# Patient Record
Sex: Female | Born: 1965 | Race: White | Hispanic: No | Smoking: Former smoker
Health system: Southern US, Community
[De-identification: ages and names within clinical notes are randomized; demographics above are authoritative.]

## PROBLEM LIST (undated history)

## (undated) ENCOUNTER — Ambulatory Visit: Admission: EM | Source: Home / Self Care

## (undated) DIAGNOSIS — K219 Gastro-esophageal reflux disease without esophagitis: Secondary | ICD-10-CM

## (undated) DIAGNOSIS — M5136 Other intervertebral disc degeneration, lumbar region: Secondary | ICD-10-CM

## (undated) DIAGNOSIS — I10 Essential (primary) hypertension: Secondary | ICD-10-CM

## (undated) DIAGNOSIS — M51369 Other intervertebral disc degeneration, lumbar region without mention of lumbar back pain or lower extremity pain: Secondary | ICD-10-CM

## (undated) HISTORY — DX: Other intervertebral disc degeneration, lumbar region without mention of lumbar back pain or lower extremity pain: M51.369

## (undated) HISTORY — PX: CARPAL TUNNEL RELEASE: SHX101

## (undated) HISTORY — DX: Other intervertebral disc degeneration, lumbar region: M51.36

## (undated) HISTORY — PX: DILATION AND CURETTAGE OF UTERUS: SHX78

## (undated) HISTORY — DX: Gastro-esophageal reflux disease without esophagitis: K21.9

## (undated) HISTORY — PX: TRACHEOSTOMY: SUR1362

## (undated) HISTORY — PX: ARTHROSCOPIC REPAIR ACL: SUR80

## (undated) HISTORY — PX: OTHER SURGICAL HISTORY: SHX169

---
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
## 2019-10-27 ENCOUNTER — Emergency Department (INDEPENDENT_AMBULATORY_CARE_PROVIDER_SITE_OTHER)
Admission: EM | Admit: 2019-10-27 | Discharge: 2019-10-27 | Disposition: A | Discharge status: Home / Self Care | Payer: BLUE CROSS/BLUE SHIELD | Source: Home / Self Care

## 2019-10-27 ENCOUNTER — Emergency Department (INDEPENDENT_AMBULATORY_CARE_PROVIDER_SITE_OTHER): Payer: BLUE CROSS/BLUE SHIELD

## 2019-10-27 ENCOUNTER — Other Ambulatory Visit: Payer: Self-pay

## 2019-10-27 DIAGNOSIS — S62662A Nondisplaced fracture of distal phalanx of right middle finger, initial encounter for closed fracture: Secondary | ICD-10-CM | POA: Diagnosis not present

## 2019-10-27 MED ORDER — TRAMADOL HCL 50 MG PO TABS: 50.0000 mg | ORAL_TABLET | Freq: Four times a day (QID) | ORAL | 0 refills | Status: DC | PRN

## 2019-10-27 NOTE — ED Triage Notes (Signed)
Patient presents to Urgent Care with complaints of right middle finger pain since it got caught in her dog's leash while walking him around 0900 this morning. Patient reports she thinks she may have broken the tip of it, is unable to bend it.

## 2019-10-27 NOTE — ED Provider Notes (Signed)
[facility]    CSN: [id number] Arrival date & time: 10/27/19  1411      History   Chief Complaint Chief Complaint  Patient presents with  . Finger Injury    HPI Katrina Cole is a 54 y.o. female.   HPI  Katrina Cole is a 54 y.o. female presenting to UC with c/o sudden onset, gradually worsening pain and swelling to Right middle finger that occurred after she got her finger caught in her dog's leash as he pulled while they were walking around 9AM this morning. Pain is aching and sore, 8/10. Limited ROM.  She is Right hand dominant. No prior injury to that same finger in the past.    History reviewed. No pertinent past medical history.  There are no problems to display for this patient.   History reviewed. No pertinent surgical history.  OB History   No obstetric history on file.      Home Medications    Prior to Admission medications   Medication Sig Start Date End Date Taking? Authorizing Provider  omeprazole (PRILOSEC) 10 MG capsule Take 10 mg by mouth daily.   Yes [provider]  traMADol (ULTRAM) 50 MG tablet Take 1 tablet (50 mg total) by mouth every 6 (six) hours as needed. 10/27/19   Noe Gens, PA-C    Family History Family History  Problem Relation Age of Onset  . COPD Mother   . Hypertension Father     Social History Social History   Tobacco Use  . Smoking status: Never Smoker  . Smokeless tobacco: Never Used  Substance Use Topics  . Alcohol use: Not Currently  . Drug use: Not on file     Allergies   Penicillins   Review of Systems Review of Systems  Musculoskeletal: Positive for arthralgias and joint swelling.  Skin: Positive for color change. Negative for wound.  Neurological: Positive for weakness. Negative for numbness.     Physical Exam Triage Vital Signs ED Triage Vitals  Enc Vitals Group     BP 10/27/19 1428 (!) 157/84     Pulse Rate 10/27/19 1428 95     Resp 10/27/19 1428 16     Temp 10/27/19  1428 98.8 F (37.1 C)     Temp Source 10/27/19 1428 Oral     SpO2 10/27/19 1428 100 %     Weight --      Height --      Head Circumference --      Peak Flow --      Pain Score 10/27/19 1424 8     Pain Loc --      Pain Edu? --      Excl. in [place]? --    No data found.  Updated Vital Signs BP (!) 157/84 (BP Location: Left Arm)   Pulse 95   Temp 98.8 F (37.1 C) (Oral)   Resp 16   SpO2 100%   Visual Acuity Right Eye Distance:   Left Eye Distance:   Bilateral Distance:    Right Eye Near:   Left Eye Near:    Bilateral Near:     Physical Exam Vitals and nursing note reviewed.  Constitutional:      Appearance: Normal appearance. She is well-developed.  HENT:     Head: Normocephalic and atraumatic.  Cardiovascular:     Rate and Rhythm: Normal rate.  Pulmonary:     Effort: Pulmonary effort is normal.  Musculoskeletal:  General: Swelling and tenderness present.     Cervical back: Normal range of motion.     Comments: Right middle finger: moderate edema around DIP, tender. Limited flexion at DIP, full flexion at PIP.    Skin:    General: Skin is warm and dry.     Capillary Refill: Capillary refill takes less than 2 seconds.     Findings: Bruising present. No erythema.     Comments: Right middle finger: skin and nail in tact. Faint ecchymosis around DIP.   Neurological:     Mental Status: She is alert and oriented to person, place, and time.     Sensory: No sensory deficit.  Psychiatric:        Behavior: Behavior normal.      UC Treatments / Results  Labs (all labs ordered are listed, but only abnormal results are displayed) Labs Reviewed - No data to display  EKG   Radiology DG Finger Middle Right  Result Date: 10/27/2019 CLINICAL DATA:  Trauma to right middle finger last night. EXAM: RIGHT MIDDLE FINGER 2+V COMPARISON:  None. FINDINGS: Subtle minimally displaced chip fracture along the ulnar base of the third distal phalanx with involvement of the  articular surface. Remainder the exam is unremarkable. IMPRESSION: Minimally displaced chip fracture at the base of the third distal phalanx. Electronically Signed   By: Elberta Fortis M.D.   On: 10/27/2019 15:08    Procedures Splint Application  Date/Time: 10/27/2019 7:49 PM Performed by: Lurene Shadow, PA-C Authorized by: Lurene Shadow, PA-C   Consent:    Consent obtained:  Verbal   Consent given by:  Patient   Risks discussed:  Discoloration, numbness, pain and swelling   Alternatives discussed:  Delayed treatment and no treatment Pre-procedure details:    Sensation:  Normal   Skin color:  Faint ecchymosis Procedure details:    Laterality:  Right   Location:  Finger   Finger:  R long finger   Strapping: no     Splint type:  Finger   Supplies:  Elastic bandage Post-procedure details:    Pain:  Unchanged   Sensation:  Normal   Patient tolerance of procedure:  Tolerated well, no immediate complications   (including critical care time)  Medications Ordered in UC Medications - No data to display  Initial Impression / Assessment and Plan / UC Course  I have reviewed the triage vital signs and the nursing notes.  Pertinent labs & imaging results that were available during my care of the patient were reviewed by me and considered in my medical decision making (see chart for details).     Discussed imaging with pt Encouraged f/u with Sports Medicine next week  AVS provided  Final Clinical Impressions(s) / UC Diagnoses   Final diagnoses:  Closed nondisplaced fracture of distal phalanx of right middle finger, initial encounter     Discharge Instructions      Try to keep the finger splint on at all times except for applying a cool compress to help with pain and swelling. Try to keep hand elevated.  Tramadol is strong pain medication. While taking, do not drink alcohol, drive, or perform any other activities that requires focus while taking these medications.   You  may take 500mg  acetaminophen every 4-6 hours or in combination with ibuprofen 400-600mg  every 6-8 hours as needed for pain and inflammation.  Call to schedule a follow up appointment with Dr. (Dr. Benjamin Stain) sports medicine next week since the finger is  on your dominant hand to help ensure proper healing.      ED Prescriptions    Medication Sig Dispense Auth. Provider   traMADol (ULTRAM) 50 MG tablet Take 1 tablet (50 mg total) by mouth every 6 (six) hours as needed. 15 tablet Lurene Shadow, [place]     I have reviewed the PDMP during this encounter.   Lurene Shadow, [place] 10/27/19 1950

## 2019-10-27 NOTE — Discharge Instructions (Signed)
  Try to keep the finger splint on at all times except for applying a cool compress to help with pain and swelling. Try to keep hand elevated.  Tramadol is strong pain medication. While taking, do not drink alcohol, drive, or perform any other activities that requires focus while taking these medications.   You may take 500mg  acetaminophen every 4-6 hours or in combination with ibuprofen 400-600mg  every 6-8 hours as needed for pain and inflammation.  Call to schedule a follow up appointment with Dr. (Dr. Benjamin Stain) sports medicine next week since the finger is on your dominant hand to help ensure proper healing.

## 2019-11-01 ENCOUNTER — Encounter: Payer: Self-pay | Admitting: Sports Medicine

## 2019-11-01 ENCOUNTER — Other Ambulatory Visit: Payer: Self-pay

## 2019-11-01 ENCOUNTER — Ambulatory Visit (INDEPENDENT_AMBULATORY_CARE_PROVIDER_SITE_OTHER): Payer: BLUE CROSS/BLUE SHIELD | Admitting: Sports Medicine

## 2019-11-01 DIAGNOSIS — S62662A Nondisplaced fracture of distal phalanx of right middle finger, initial encounter for closed fracture: Secondary | ICD-10-CM

## 2019-11-01 DIAGNOSIS — S62632A Displaced fracture of distal phalanx of right middle finger, initial encounter for closed fracture: Secondary | ICD-10-CM | POA: Insufficient documentation

## 2019-11-01 MED ORDER — CALCIUM CARBONATE-VITAMIN D 600-400 MG-UNIT PO TABS: 1.0000 | ORAL_TABLET | Freq: Two times a day (BID) | ORAL | 11 refills | Status: DC

## 2019-11-01 MED ORDER — HYDROCODONE-ACETAMINOPHEN 5-325 MG PO TABS: 1.0000 | ORAL_TABLET | Freq: Three times a day (TID) | ORAL | 0 refills | Status: DC | PRN

## 2019-11-01 NOTE — Assessment & Plan Note (Signed)
Katrina Cole was walking her dog, she had the leash wrapped around her finger, the large husky bolted, and I broke her finger. She has significant pain, we applied a stack splint today, adding hydrocodone for pain. Calcium and vitamin D supplement. This will likely takes 4 to 6 weeks to heal. She does work at [employer] and so she will likely need to be on left-handed duty only, this will necessitate short-term disability.

## 2019-11-01 NOTE — Progress Notes (Signed)
    Procedures performed today:    None.  Independent interpretation of notes and tests performed by another provider:   None.  Brief History, Exam, Impression, and Recommendations:    Fracture of distal phalanx of right middle finger Katrina Cole was walking her dog, she had the leash wrapped around her finger, the large husky bolted, and I broke her finger. She has significant pain, we applied a stack splint today, adding hydrocodone for pain. Calcium and vitamin D supplement. This will likely takes 4 to 6 weeks to heal. She does work at [employer] and so she will likely need to be on left-handed duty only, this will necessitate short-term disability.    ___________________________________________ Ihor Austin. Benjamin Stain, M.D., ABFM., CAQSM. Primary Care and Sports Medicine [facility]  Adjunct Instructor of Family Medicine  [employer]

## 2019-11-14 ENCOUNTER — Other Ambulatory Visit: Payer: Self-pay

## 2019-11-14 ENCOUNTER — Emergency Department
Admission: EM | Admit: 2019-11-14 | Discharge: 2019-11-14 | Disposition: A | Discharge status: Home / Self Care | Payer: BLUE CROSS/BLUE SHIELD | Source: Home / Self Care | Attending: Family Medicine | Admitting: Family Medicine

## 2019-11-14 DIAGNOSIS — N3000 Acute cystitis without hematuria: Secondary | ICD-10-CM

## 2019-11-14 DIAGNOSIS — R300 Dysuria: Secondary | ICD-10-CM

## 2019-11-14 LAB — POCT URINALYSIS DIP (MANUAL ENTRY)
Bilirubin, UA: NEGATIVE
Glucose, UA: NEGATIVE mg/dL
Ketones, POC UA: NEGATIVE mg/dL
Nitrite, UA: NEGATIVE
Protein Ur, POC: NEGATIVE mg/dL
Spec Grav, UA: 1.01 (ref 1.010–1.025)
Urobilinogen, UA: 0.2 E.U./dL
pH, UA: 6 (ref 5.0–8.0)

## 2019-11-14 MED ORDER — NITROFURANTOIN MONOHYD MACRO 100 MG PO CAPS: 100.0000 mg | ORAL_CAPSULE | Freq: Two times a day (BID) | ORAL | 0 refills | Status: AC

## 2019-11-14 NOTE — Discharge Instructions (Addendum)
Increase fluid intake. May use non-prescription AZO for about two days, if desired, to decrease urinary discomfort.  If symptoms become significantly worse during the night or over the weekend, proceed to the local emergency room.  

## 2019-11-14 NOTE — ED Provider Notes (Signed)
[facility]    CSN: [id number] Arrival date & time: 11/14/19  5284      History   Chief Complaint Chief Complaint  Patient presents with  . Urinary Tract Infection    HPI Katrina Cole is a 54 y.o. female.   Three days ago patient developed urinary frequency, now worse with onset of urgency and dysuria.  She denies nocturia, hematuria, and fevers, chills, and sweats.  The history is provided by the patient.  Dysuria Pain quality:  Burning Pain severity:  Moderate Onset quality:  Sudden Duration:  3 days Timing:  Constant Progression:  Worsening Chronicity:  New Recent urinary tract infections: no   Relieved by:  None tried Worsened by:  Nothing Ineffective treatments:  None tried Urinary symptoms: frequent urination and hesitancy   Urinary symptoms: no discolored urine, no foul-smelling urine, no hematuria and no bladder incontinence   Associated symptoms: no abdominal pain, no fever, no flank pain, no nausea, no vaginal discharge and no vomiting     Past Medical History:  Diagnosis Date  . GERD (gastroesophageal reflux disease)   . Hypertension     Patient Active Problem List   Diagnosis Date Noted  . Fracture of distal phalanx of right middle finger 11/01/2019    Past Surgical History:  Procedure Laterality Date  . CARPAL TUNNEL RELEASE Bilateral 2019,2020    OB History   No obstetric history on file.      Home Medications    Prior to Admission medications   Medication Sig Start Date End Date Taking? Authorizing Provider  Calcium Carbonate-Vitamin D 600-400 MG-UNIT tablet Take 1 tablet by mouth 2 (two) times daily. 11/01/19   Silverio Decamp, MD  HYDROcodone-acetaminophen (NORCO/VICODIN) 5-325 MG tablet Take 1 tablet by mouth every 8 (eight) hours as needed for moderate pain. 11/01/19   Silverio Decamp, MD  nitrofurantoin, macrocrystal-monohydrate, (MACROBID) 100 MG capsule Take 1 capsule (100 mg total) by mouth 2 (two) times  daily for 7 days. 11/14/19 11/21/19  Kandra Nicolas, MD  omeprazole (PRILOSEC) 10 MG capsule Take 10 mg by mouth daily.    [provider]  traMADol (ULTRAM) 50 MG tablet Take 1 tablet (50 mg total) by mouth every 6 (six) hours as needed. 10/27/19   Noe Gens, PA-C    Family History Family History  Problem Relation Age of Onset  . COPD Mother   . Hypertension Father     Social History Social History   Tobacco Use  . Smoking status: Never Smoker  . Smokeless tobacco: Never Used  Substance Use Topics  . Alcohol use: Not Currently  . Drug use: Not on file     Allergies   Penicillins   Review of Systems Review of Systems  Constitutional: Negative for chills, diaphoresis, fatigue and fever.  Gastrointestinal: Negative for abdominal pain, nausea and vomiting.  Genitourinary: Positive for dysuria, frequency and urgency. Negative for flank pain, hematuria, pelvic pain and vaginal discharge.  All other systems reviewed and are negative.    Physical Exam Triage Vital Signs ED Triage Vitals  Enc Vitals Group     BP 11/14/19 0950 (!) 145/93     Pulse Rate 11/14/19 0950 99     Resp 11/14/19 0950 18     Temp 11/14/19 0950 98.3 F (36.8 C)     Temp Source 11/14/19 0950 Oral     SpO2 11/14/19 0950 97 %     Weight --      Height --  Head Circumference --      Peak Flow --      Pain Score 11/14/19 0951 0     Pain Loc --      Pain Edu? --      Excl. in [place]? --    No data found.  Updated Vital Signs BP (!) 145/93 (BP Location: Right Arm)   Pulse 99   Temp 98.3 F (36.8 C) (Oral)   Resp 18   SpO2 97%   Visual Acuity Right Eye Distance:   Left Eye Distance:   Bilateral Distance:    Right Eye Near:   Left Eye Near:    Bilateral Near:     Physical Exam Nursing notes and Vital Signs reviewed. Appearance:  Patient appears stated age, and in no acute distress.    Eyes:  Pupils are equal, round, and reactive to light and accomodation.  Extraocular  movement is intact.  Conjunctivae are not inflamed   Pharynx:  Normal; moist mucous membranes  Neck:  Supple.  No adenopathy Lungs:  Clear to auscultation.  Breath sounds are equal.  Moving air well. Heart:  Regular rate and rhythm without murmurs, rubs, or gallops.  Abdomen:  Nontender without masses or hepatosplenomegaly.  Bowel sounds are present.  No CVA or flank tenderness.  Extremities:  No edema.  Skin:  No rash present.     UC Treatments / Results  Labs (all labs ordered are listed, but only abnormal results are displayed) Labs Reviewed  POCT URINALYSIS DIP (MANUAL ENTRY) - Abnormal; Notable for the following components:      Result Value   Blood, UA trace-intact (*)    Leukocytes, UA Moderate (2+) (*)    All other components within normal limits  URINE CULTURE    EKG   Radiology No results found.  Procedures Procedures (including critical care time)  Medications Ordered in UC Medications - No data to display  Initial Impression / Assessment and Plan / UC Course  I have reviewed the triage vital signs and the nursing notes.  Pertinent labs & imaging results that were available during my care of the patient were reviewed by me and considered in my medical decision making (see chart for details).    Urine culture pending.  Begin Macrobid. Followup with Family Doctor if not improved in one week.    Final Clinical Impressions(s) / UC Diagnoses   Final diagnoses:  Dysuria  Acute cystitis without hematuria     Discharge Instructions     Increase fluid intake. May use non-prescription AZO for about two days, if desired, to decrease urinary discomfort.   If symptoms become significantly worse during the night or over the weekend, proceed to the local emergency room.     ED Prescriptions    Medication Sig Dispense Auth. Provider   nitrofurantoin, macrocrystal-monohydrate, (MACROBID) 100 MG capsule Take 1 capsule (100 mg total) by mouth 2 (two) times daily  for 7 days. 14 capsule Lattie Haw, MD        Lattie Haw, MD 11/15/19 [phone]

## 2019-11-14 NOTE — ED Triage Notes (Signed)
Pt c/o UTI sxs x 3 days. Burning with urination and urgency/frequency. Tylenol prn.

## 2019-11-15 LAB — URINE CULTURE
MICRO NUMBER:: 10436929
Result:: NO GROWTH
SPECIMEN QUALITY:: ADEQUATE

## 2019-11-29 ENCOUNTER — Ambulatory Visit (INDEPENDENT_AMBULATORY_CARE_PROVIDER_SITE_OTHER): Payer: BLUE CROSS/BLUE SHIELD | Admitting: Sports Medicine

## 2019-11-29 DIAGNOSIS — S62662D Nondisplaced fracture of distal phalanx of right middle finger, subsequent encounter for fracture with routine healing: Secondary | ICD-10-CM | POA: Diagnosis not present

## 2019-11-29 NOTE — Progress Notes (Signed)
    Procedures performed today:    None.  Independent interpretation of notes and tests performed by another provider:   None.  Brief History, Exam, Impression, and Recommendations:    Fracture of distal phalanx of right middle finger Whisper returns, she is a pleasant 54 year old female, she was walking her dog, a very large husky which ran and broke her finger. She had a small fracture at the base of her third distal phalanx. We placed her in a stack splint, she is doing a lot better today. I filled out disability forms with a return to work date December 30, 2019. I would like her to wear the splint for an additional 2 weeks, and then start physical therapy to regain her range of motion. She can see me in a month.    ___________________________________________ Ihor Austin. Benjamin Stain, M.D., ABFM., CAQSM. Primary Care and Sports Medicine [facility]  Adjunct Instructor of Family Medicine  [employer]

## 2019-11-29 NOTE — Assessment & Plan Note (Signed)
Katrina Cole returns, she is a pleasant 54 year old female, she was walking her dog, a very large husky which ran and broke her finger. She had a small fracture at the base of her third distal phalanx. We placed her in a stack splint, she is doing a lot better today. I filled out disability forms with a return to work date December 30, 2019. I would like her to wear the splint for an additional 2 weeks, and then start physical therapy to regain her range of motion. She can see me in a month.

## 2019-12-04 NOTE — Progress Notes (Signed)
New Patient Office Visit  Subjective:  Patient ID: Katrina Cole, female    DOB: [birth year]  Age: 54 y.o. MRN: [id number]  CC:  Chief Complaint  Patient presents with  . Establish Care  . Allergies    allergies have gotten bad since moving from up [place]  . Elevated BP    never dx with HTN, but has elevated BP at all doctor's visits  . Anxiety    not officially dx with Anxiety    HPI Katrina Cole presents to establish care.  Allergies- taking OTC antihistamine without relief. Nasal spray OTC helped some, unsure what the name of it is. Notes allergies are much worse this year and that she has struggled with the right side of her nose and her right eye having pressure.   Elevated BP- several visits with providers with elevated BP readings but these were all for injuries and illnesses. Does not check BP at home. Prior to being out of work for her broken finger, she was eating a diet consisting mainly of frozen foods that were high in sodium. Since being home she endorses eating a low sodium diet of fruits, veggies, and lean meats. She has lost 8lbs and has recently started exercising again.  Anxiety- lots of stress over the years. Situational related to moving down here by herself, along for the first time in 25 years, marriage fell apart, pandemic, work concerns. Endorses some irrational fears and struggling with "what ifs". Some better now that she is out of the apartment and in her own home. Anxiety doesn't interfere with work but does cause some issues with daily activities.   Past Medical History:  Diagnosis Date  . DDD (degenerative disc disease), lumbar   . GERD (gastroesophageal reflux disease)     Past Surgical History:  Procedure Laterality Date  . ARTHROSCOPIC REPAIR ACL    . CARPAL TUNNEL RELEASE Bilateral 2019,2020  . CESAREAN SECTION    . TRACHEOSTOMY      Family History  Problem Relation Age of Onset  . COPD Mother   . Hypertension Father     Social History    Socioeconomic History  . Marital status: Legally Separated    Spouse name: Not on file  . Number of children: Not on file  . Years of education: Not on file  . Highest education level: Not on file  Occupational History  . Not on file  Tobacco Use  . Smoking status: Former Smoker    Quit date: 2008    Years since quitting: 13.4  . Smokeless tobacco: Never Used  Substance and Sexual Activity  . Alcohol use: Yes    Alcohol/week: 2.0 - 3.0 standard drinks    Types: 2 - 3 Cans of beer per week  . Drug use: Never  . Sexual activity: Not Currently  Other Topics Concern  . Not on file  Social History Narrative  . Not on file   Social Determinants of Health   Financial Resource Strain:   . Difficulty of Paying Living Expenses:   Food Insecurity:   . Worried About Programme researcher, broadcasting/film/video in the Last Year:   . Barista in the Last Year:   Transportation Needs:   . Freight forwarder (Medical):   Marland Kitchen Lack of Transportation (Non-Medical):   Physical Activity:   . Days of Exercise per Week:   . Minutes of Exercise per Session:   Stress:   . Feeling of Stress :  Social Connections:   . Frequency of Communication with Friends and Family:   . Frequency of Social Gatherings with Friends and Family:   . Attends Religious Services:   . Active Member of Clubs or Organizations:   . Attends Banker Meetings:   Marland Kitchen Marital Status:   Intimate Partner Violence:   . Fear of Current or Ex-Partner:   . Emotionally Abused:   Marland Kitchen Physically Abused:   . Sexually Abused:     ROS Review of Systems  Constitutional: Negative for chills, fatigue, fever and unexpected weight change.  HENT: Positive for sinus pressure. Negative for congestion and rhinorrhea.   Respiratory: Negative for cough, chest tightness and shortness of breath.   Cardiovascular: Positive for leg swelling. Negative for chest pain and palpitations.  Endocrine: Negative for cold intolerance and heat  intolerance.  Genitourinary: Negative for dysuria, frequency, urgency, vaginal bleeding and vaginal discharge.  Skin: Negative for rash and wound.  Neurological: Negative for dizziness, light-headedness and headaches.  Hematological: Does not bruise/bleed easily.  Psychiatric/Behavioral: Negative for self-injury, sleep disturbance and suicidal ideas. The patient is nervous/anxious.    Depression screen [facility] 2/9 12/05/2019  Decreased Interest 0  Down, Depressed, Hopeless 0  PHQ - 2 Score 0  Altered sleeping 0  Tired, decreased energy 0  Change in appetite 0  Feeling bad or failure about yourself  0  Trouble concentrating 0  Moving slowly or fidgety/restless 0  Suicidal thoughts 0  PHQ-9 Score 0  Difficult doing work/chores Not difficult at all   GAD 7 : Generalized Anxiety Score 12/05/2019  Nervous, Anxious, on Edge 1  Control/stop worrying 1  Worry too much - different things 1  Trouble relaxing 0  Restless 1  Easily annoyed or irritable 0  Afraid - awful might happen 1  Total GAD 7 Score 5  Anxiety Difficulty Not difficult at all    Objective:   Today's Vitals: BP 130/86   Pulse 91   Temp 98.9 F (37.2 C) (Oral)   Ht 5\' 6"  (1.676 m)   Wt 220 lb 12.8 oz (100.2 kg)   LMP  (LMP Unknown)   SpO2 98%   BMI 35.64 kg/m   Physical Exam Vitals reviewed.  Constitutional:      General: She is not in acute distress.    Appearance: Normal appearance.  HENT:     Head: Normocephalic and atraumatic.  Neck:      Comments: Right-sided enlargement just above old tracheostomy scar. Questionable scar tissue vs. Thyromegaly. Cardiovascular:     Rate and Rhythm: Normal rate and regular rhythm.     Pulses: Normal pulses.     Heart sounds: Normal heart sounds. No murmur. No friction rub. No gallop.   Pulmonary:     Effort: Pulmonary effort is normal. No respiratory distress.     Breath sounds: Normal breath sounds. No wheezing.  Skin:    General: Skin is warm and dry.    Neurological:     Mental Status: She is alert and oriented to person, place, and time.  Psychiatric:        Mood and Affect: Mood normal.        Behavior: Behavior normal.        Thought Content: Thought content normal.        Judgment: Judgment normal.     Assessment & Plan:   1. Encounter to establish care Reviewed records and discussed pertinent history with patient. Well overdue on preventative care. Labs ordered  for future visit but no preventative care provided today.  2. Encounter for screening mammogram for malignant neoplasm of breast Never had a mammogram. Ordered today. - MM 3D SCREEN BREAST BILATERAL; Future  3. Screening for colon cancer Due for colonoscopy. Referral to GI entered today. - Ambulatory referral to Gastroenterology  4. Screening for cervical cancer Would like to seek women's health care with the OB/GYN downstairs. Referral entered. - Ambulatory referral to Obstetrics / Gynecology  5. Thyromegaly Mild right sided thyromegaly with skin changes and increased hair loss. Checking TSH.  6. Anxiety Discussed options for treatment of anxiety. Patient would like to think about starting a maintenance medication to help with her anxiety. Recommend establishing a counseling relationship. Patient amenable to having referral to behavioral health placed and if she decides she does not want to go forward with this, she will declined to establish the appointment.  Outpatient Encounter Medications as of 12/05/2019  Medication Sig  . Calcium Carbonate-Vitamin D 600-400 MG-UNIT tablet Take 1 tablet by mouth 2 (two) times daily.  Marland Kitchen loratadine (CLARITIN) 10 MG tablet Take 10 mg by mouth daily.  Marland Kitchen omeprazole (PRILOSEC) 10 MG capsule Take 4 capsules (40 mg total) by mouth daily.  . [DISCONTINUED] omeprazole (PRILOSEC) 10 MG capsule Take 10 mg by mouth daily.  . cetirizine (ZYRTEC) 10 MG tablet Take 1 tablet (10 mg total) by mouth daily.  . fluticasone (FLONASE) 50 MCG/ACT  nasal spray Place 2 sprays into both nostrils daily.  . [DISCONTINUED] HYDROcodone-acetaminophen (NORCO/VICODIN) 5-325 MG tablet Take 1 tablet by mouth every 8 (eight) hours as needed for moderate pain. (Patient not taking: Reported on 12/05/2019)  . [DISCONTINUED] traMADol (ULTRAM) 50 MG tablet Take 1 tablet (50 mg total) by mouth every 6 (six) hours as needed. (Patient not taking: Reported on 12/05/2019)   No facility-administered encounter medications on file as of 12/05/2019.    Follow-up: Return for follow up of GERD/anxiety in 3-6 months. Sooner if needed.  Clearnce Sorrel, DNP, APRN, FNP-BC [facility]

## 2019-12-05 ENCOUNTER — Encounter: Payer: Self-pay | Admitting: Medical-Surgical

## 2019-12-05 ENCOUNTER — Ambulatory Visit (INDEPENDENT_AMBULATORY_CARE_PROVIDER_SITE_OTHER): Payer: BLUE CROSS/BLUE SHIELD | Admitting: Medical-Surgical

## 2019-12-05 ENCOUNTER — Other Ambulatory Visit: Payer: Self-pay | Admitting: Medical-Surgical

## 2019-12-05 VITALS — BP 130/86 | HR 91 | Temp 98.9°F | Ht 66.0 in | Wt 220.8 lb

## 2019-12-05 DIAGNOSIS — F419 Anxiety disorder, unspecified: Secondary | ICD-10-CM

## 2019-12-05 DIAGNOSIS — Z0000 Encounter for general adult medical examination without abnormal findings: Secondary | ICD-10-CM

## 2019-12-05 DIAGNOSIS — Z1211 Encounter for screening for malignant neoplasm of colon: Secondary | ICD-10-CM

## 2019-12-05 DIAGNOSIS — Z1231 Encounter for screening mammogram for malignant neoplasm of breast: Secondary | ICD-10-CM | POA: Diagnosis not present

## 2019-12-05 DIAGNOSIS — Z124 Encounter for screening for malignant neoplasm of cervix: Secondary | ICD-10-CM | POA: Diagnosis not present

## 2019-12-05 DIAGNOSIS — E010 Iodine-deficiency related diffuse (endemic) goiter: Secondary | ICD-10-CM

## 2019-12-05 DIAGNOSIS — Z7689 Persons encountering health services in other specified circumstances: Secondary | ICD-10-CM | POA: Diagnosis not present

## 2019-12-05 MED ORDER — CETIRIZINE HCL 10 MG PO TABS: 10.0000 mg | ORAL_TABLET | Freq: Every day | ORAL | 11 refills | Status: DC

## 2019-12-05 MED ORDER — OMEPRAZOLE 40 MG PO CPDR: 40.0000 mg | DELAYED_RELEASE_CAPSULE | Freq: Every day | ORAL | 3 refills | Status: DC

## 2019-12-05 MED ORDER — FLUTICASONE PROPIONATE 50 MCG/ACT NA SUSP: 2.0000 | Freq: Every day | NASAL | 0 refills | Status: DC

## 2019-12-05 MED ORDER — OMEPRAZOLE 10 MG PO CPDR: 40.0000 mg | DELAYED_RELEASE_CAPSULE | Freq: Every day | ORAL | 3 refills | Status: DC

## 2019-12-07 ENCOUNTER — Telehealth: Payer: Self-pay | Admitting: *Deleted

## 2019-12-07 NOTE — Telephone Encounter (Signed)
Left patient a message to call and reschedule with Venia Carbon, NP if she wants to be seen earlier than with a MD on 01/22/2020.

## 2019-12-14 ENCOUNTER — Ambulatory Visit: Payer: BLUE CROSS/BLUE SHIELD | Admitting: Rehabilitative and Restorative Service Providers"

## 2019-12-15 ENCOUNTER — Ambulatory Visit: Payer: BLUE CROSS/BLUE SHIELD | Admitting: Rehabilitative and Restorative Service Providers"

## 2019-12-15 ENCOUNTER — Other Ambulatory Visit: Payer: Self-pay

## 2019-12-15 ENCOUNTER — Ambulatory Visit: Payer: BLUE CROSS/BLUE SHIELD | Admitting: Physical Therapy

## 2019-12-15 DIAGNOSIS — M6281 Muscle weakness (generalized): Secondary | ICD-10-CM | POA: Diagnosis not present

## 2019-12-15 DIAGNOSIS — M25641 Stiffness of right hand, not elsewhere classified: Secondary | ICD-10-CM

## 2019-12-15 DIAGNOSIS — M25541 Pain in joints of right hand: Secondary | ICD-10-CM

## 2019-12-15 DIAGNOSIS — R600 Localized edema: Secondary | ICD-10-CM | POA: Diagnosis not present

## 2019-12-15 LAB — CBC
HCT: 40.9 % (ref 35.0–45.0)
Hemoglobin: 13.8 g/dL (ref 11.7–15.5)
MCH: 30.7 pg (ref 27.0–33.0)
MCHC: 33.7 g/dL (ref 32.0–36.0)
MCV: 90.9 fL (ref 80.0–100.0)
MPV: 10.5 fL (ref 7.5–12.5)
Platelets: 317 10*3/uL (ref 140–400)
RBC: 4.5 10*6/uL (ref 3.80–5.10)
RDW: 12.4 % (ref 11.0–15.0)
WBC: 6.6 10*3/uL (ref 3.8–10.8)

## 2019-12-15 LAB — COMPLETE METABOLIC PANEL WITH GFR
AG Ratio: 1.7 (calc) (ref 1.0–2.5)
ALT: 32 U/L — ABNORMAL HIGH (ref 6–29)
AST: 31 U/L (ref 10–35)
Albumin: 4.2 g/dL (ref 3.6–5.1)
Alkaline phosphatase (APISO): 89 U/L (ref 37–153)
BUN: 13 mg/dL (ref 7–25)
CO2: 31 mmol/L (ref 20–32)
Calcium: 9.6 mg/dL (ref 8.6–10.4)
Chloride: 103 mmol/L (ref 98–110)
Creat: 0.76 mg/dL (ref 0.50–1.05)
GFR, Est African American: 105 mL/min/{1.73_m2} (ref >=60)
GFR, Est Non African American: 90 mL/min/{1.73_m2} (ref >=60)
Globulin: 2.5 g/dL (calc) (ref 1.9–3.7)
Glucose, Bld: 95 mg/dL (ref 65–99)
Potassium: 4.1 mmol/L (ref 3.5–5.3)
Sodium: 141 mmol/L (ref 135–146)
Total Bilirubin: 0.5 mg/dL (ref 0.2–1.2)
Total Protein: 6.7 g/dL (ref 6.1–8.1)

## 2019-12-15 LAB — LIPID PANEL
Cholesterol: 226 mg/dL — ABNORMAL HIGH (ref <200)
HDL: 58 mg/dL (ref >=50)
LDL Cholesterol (Calc): 142 mg/dL (calc) — ABNORMAL HIGH
Non-HDL Cholesterol (Calc): 168 mg/dL (calc) — ABNORMAL HIGH (ref <130)
Total CHOL/HDL Ratio: 3.9 (calc) (ref <5.0)
Triglycerides: 140 mg/dL (ref <150)

## 2019-12-15 LAB — TSH: TSH: 1.05 mIU/L

## 2019-12-15 NOTE — Therapy (Signed)
[facility] [address] [place], [place], [address] Phone: [phone]   Fax:  [phone]  Physical Therapy Evaluation  Patient Details  Name: Katrina Cole MRN: [id number] Date of Birth: [birth year] Referring Provider (PT): Monica Becton, MD   Encounter Date: 12/15/2019  PT End of Session - 12/15/19 0930    Visit Number  1    Number of Visits  6    Date for PT Re-Evaluation  01/26/20    PT Start Time  0835    PT Stop Time  0920    PT Time Calculation (min)  45 min    Activity Tolerance  Patient tolerated treatment well    Behavior During Therapy  [facility] for tasks assessed/performed       Past Medical History:  Diagnosis Date  . DDD (degenerative disc disease), lumbar   . GERD (gastroesophageal reflux disease)     Past Surgical History:  Procedure Laterality Date  . ARTHROSCOPIC REPAIR ACL    . CARPAL TUNNEL RELEASE Bilateral 2019,2020  . CESAREAN SECTION    . TRACHEOSTOMY      There were no vitals filed for this visit.   Subjective Assessment - 12/15/19 0837    Subjective  Pt reports breaking R middle finger on April 16th when her dog pulled on his leash. Pt has been in the splint for 6 weeks. Pt states she saw Dr. Karie Schwalbe 2 weeks ago and states she is free to be out of her splint for therapy. Pt notes that she tried to take her finger out of the splint yesterday and has difficulty with grip. Pt notes sensitive/tingling sensation on middle finger. Pt is cleared to return to work June 19.    Limitations  Lifting;House hold activities    Patient Stated Goals  Return to work and be able to lift and grasp as normal    Currently in Pain?  No/denies         [facility] PT Assessment - 12/15/19 0001      Assessment   Medical Diagnosis  S62.662D (ICD-10-CM) - Closed nondisplaced fracture of distal phalanx of right middle finger with routine healing, subsequent encounter    Referring Provider (PT)  Monica Becton, MD    Onset Date/Surgical Date  10/27/19    Hand Dominance  Right    Next MD Visit  Jun 16    Prior Therapy  None      Balance Screen   Has the patient fallen in the past 6 months  No      Home Environment   Living Environment  Private residence    Living Arrangements  Alone   Daughter and grandchildren currently living with pt   Available Help at Discharge  Family    Type of Home  House      Prior Function   Level of Independence  Independent    Vocation  Full time employment    Vocation Requirements  Requires to lift and handle boxes at [employer]      Observation/Other Assessments   Focus on Therapeutic Outcomes (FOTO)   29%      AROM   Right Composite Finger Extension  --   WFL   Right Composite Finger Flexion  --   100 deg flexion MCP, 75 deg flex PIP, 45 deg flex DIP   Left Composite Finger Flexion  --   110 deg flex MCP, 115 deg flex PIP, 85 deg flex DIP  PROM   Right Composite Finger Flexion  --   90 deg PIP, 55 DIP     Strength   Right Hand Grip (lbs)  50    Right Hand Lateral Pinch  0.5 lbs    Right Hand 3 Point Pinch  10 lbs    Left Hand Grip (lbs)  80    Left Hand Lateral Pinch  5 lbs   middle finger pinch   Left Hand 3 Point Pinch  11 lbs      Palpation   Palpation comment  TTP along lateral DIP; pt reports increased sensitivity                  Objective measurements completed on examination: See above findings.      [facility] Adult PT Treatment/Exercise - 12/15/19 0001      Hand Exercises   MCPJ Flexion  10 reps   ball grip   MCPJ Extension  --   rubber band   DIPJ Flexion  AROM;10 reps;Both;PROM    Tendon Glides  10 reps: lumbrical grip, half grip, hook grip, full grip    Rubberbands  10 reps into extension    Other Hand Exercises  Pinching on clothes pin x 10 reps      Wrist Exercises   Wrist Flexion  AROM;10 reps               PT Short Term Goals - 12/15/19 1042      PT SHORT TERM GOAL #1   Title   Pt will be able to return to work without issues holding and grasping    Time  3    Period  Weeks    Status  New    Target Date  01/05/20        PT Long Term Goals - 12/15/19 1043      PT LONG TERM GOAL #1   Title  Pt will be independent with her HEP    Time  6    Period  Weeks    Status  New    Target Date  01/26/20      PT LONG TERM GOAL #2   Title  Pt will improve R hand grip to 80 lbs to be equal with L hand    Baseline  50 lbs R hand on dynamometer    Time  6    Period  Weeks    Status  New    Target Date  01/26/20      PT LONG TERM GOAL #3   Title  Pt will improve R finger ROM to be symmetrical with L finger    Baseline  Right: 100 deg flexion MCP, 75 deg flex PIP, 45 deg flex DIP; Left:110 deg flex MCP, 115 deg flex PIP, 85 deg flex DIP    Time  6    Period  Weeks    Status  New    Target Date  01/26/20      PT LONG TERM GOAL #4   Title  FOTO score will improve to </=22% impairment    Baseline  29% impairment via FOTO    Time  6    Period  Weeks    Status  New    Target Date  01/26/20             Plan - 12/15/19 0931    Clinical Impression Statement  Pt is a 54 y/o F presenting to OPPT after 6 weeks of splinting  a nondisplaced fracture of her distal right middle phalanx. Pt presents with decreased finger ROM, strength, and grip affecting pt's ability to perform job and home tasks requiring lifting, grasping, and gripping. Pt would benefit from therapy to address these issues to return her to her prior level of function.    Personal Factors and Comorbidities  Time since onset of injury/illness/exacerbation;Profession    Examination-Activity Limitations  Lift;Carry    Examination-Participation Restrictions  Cleaning;Community Activity;Other   working at [employer] Making  Stable/Uncomplicated    Clinical Decision Making  Low    Rehab Potential  Good    PT Frequency  1x / week    PT Duration  6 weeks    PT  Treatment/Interventions  ADLs/Self Care Home Management;Cryotherapy;Ultrasound;Therapeutic activities;Functional mobility training;Therapeutic exercise;Neuromuscular re-education;Patient/family education;Manual techniques;Passive range of motion    PT Next Visit Plan  Continue finger/hand/wrist ROM and strengthening. Work on continued grasping and pinching. Consider ice and/or [place].    PT Home Exercise Plan  [id number]    Consulted and Agree with Plan of Care  Patient       Patient will benefit from skilled therapeutic intervention in order to improve the following deficits and impairments:  Hypomobility, Decreased mobility, Impaired sensation, Decreased range of motion, Increased edema, Decreased strength, Increased fascial restricitons, Impaired flexibility, Impaired UE functional use  Visit Diagnosis: Stiffness of right hand, not elsewhere classified  Pain in joints of right hand  Muscle weakness (generalized)  Localized edema     Problem List Patient Active Problem List   Diagnosis Date Noted  . Fracture of distal phalanx of right middle finger 11/01/2019    [facility] April Ma L [place] PT, DPT 12/15/2019, 10:57 AM  [facility] [place], [place], [address] Phone: [phone]   Fax:  [phone]  Name: Tylea Hise MRN: [id number] Date of Birth: [birth year]

## 2019-12-20 ENCOUNTER — Encounter: Payer: BLUE CROSS/BLUE SHIELD | Admitting: Physical Therapy

## 2019-12-21 ENCOUNTER — Ambulatory Visit: Payer: BLUE CROSS/BLUE SHIELD

## 2019-12-27 ENCOUNTER — Ambulatory Visit (INDEPENDENT_AMBULATORY_CARE_PROVIDER_SITE_OTHER): Payer: BLUE CROSS/BLUE SHIELD

## 2019-12-27 ENCOUNTER — Ambulatory Visit (INDEPENDENT_AMBULATORY_CARE_PROVIDER_SITE_OTHER): Payer: BLUE CROSS/BLUE SHIELD | Admitting: Sports Medicine

## 2019-12-27 ENCOUNTER — Other Ambulatory Visit: Payer: Self-pay

## 2019-12-27 ENCOUNTER — Encounter: Payer: Self-pay | Admitting: Sports Medicine

## 2019-12-27 DIAGNOSIS — S99921A Unspecified injury of right foot, initial encounter: Secondary | ICD-10-CM | POA: Diagnosis not present

## 2019-12-27 DIAGNOSIS — S62662A Nondisplaced fracture of distal phalanx of right middle finger, initial encounter for closed fracture: Secondary | ICD-10-CM

## 2019-12-27 DIAGNOSIS — S62662D Nondisplaced fracture of distal phalanx of right middle finger, subsequent encounter for fracture with routine healing: Secondary | ICD-10-CM

## 2019-12-27 MED ORDER — HYDROCODONE-ACETAMINOPHEN 5-325 MG PO TABS: 1.0000 | ORAL_TABLET | Freq: Three times a day (TID) | ORAL | 0 refills | Status: DC | PRN

## 2019-12-27 NOTE — Progress Notes (Signed)
    Procedures performed today:    Fourth and fifth toes were buddy taped together  Independent interpretation of notes and tests performed by another provider:   None.  Brief History, Exam, Impression, and Recommendations:    Right foot injury Kicked an object with the fifth toe, she has tenderness over the proximal phalanx, buddy taped fourth and fifth toes together, adding a postop shoe, x-rays, hydrocodone, return to see me in 4 weeks. Out of work for 2 weeks followed by 2 weeks of half days.  Fracture of distal phalanx of right middle finger Out of stack splint, no pain, full motion, return as needed for this.    ___________________________________________ Ihor Austin. Benjamin Stain, M.D., ABFM., CAQSM. Primary Care and Sports Medicine [facility]  Adjunct Instructor of Family Medicine  [employer]

## 2019-12-27 NOTE — Assessment & Plan Note (Signed)
Out of stack splint, no pain, full motion, return as needed for this.

## 2019-12-27 NOTE — Assessment & Plan Note (Signed)
Kicked an object with the fifth toe, she has tenderness over the proximal phalanx, buddy taped fourth and fifth toes together, adding a postop shoe, x-rays, hydrocodone, return to see me in 4 weeks. Out of work for 2 weeks followed by 2 weeks of half days.

## 2019-12-29 ENCOUNTER — Other Ambulatory Visit: Payer: Self-pay

## 2019-12-29 ENCOUNTER — Ambulatory Visit (INDEPENDENT_AMBULATORY_CARE_PROVIDER_SITE_OTHER): Payer: BLUE CROSS/BLUE SHIELD | Admitting: Physical Therapy

## 2019-12-29 ENCOUNTER — Encounter: Payer: Self-pay | Admitting: Physical Therapy

## 2019-12-29 DIAGNOSIS — M25541 Pain in joints of right hand: Secondary | ICD-10-CM | POA: Diagnosis not present

## 2019-12-29 DIAGNOSIS — M6281 Muscle weakness (generalized): Secondary | ICD-10-CM | POA: Diagnosis not present

## 2019-12-29 DIAGNOSIS — R600 Localized edema: Secondary | ICD-10-CM | POA: Diagnosis not present

## 2019-12-29 DIAGNOSIS — M25641 Stiffness of right hand, not elsewhere classified: Secondary | ICD-10-CM | POA: Diagnosis not present

## 2019-12-29 NOTE — Patient Instructions (Addendum)
Kinesiology tape What is kinesiology tape?  There are many brands of kinesiology tape.  KTape, [employer], [employer], Dynamic tape, to name a few. It is an elasticized tape designed to support the body's natural healing process. This tape provides stability and support to muscles and joints without restricting motion. It can also help decrease swelling in the area of application. How does it work? The tape microscopically lifts and decompresses the skin to allow for drainage of lymph (swelling) to flow away from area, reducing inflammation.  The tape has the ability to help re-educate the neuromuscular system by targeting specific receptors in the skin.  The presence of the tape increases the body's awareness of posture and body mechanics.  Do not use with: . Open wounds . Skin lesions . Adhesive allergies Safe removal of the tape: In some rare cases, mild/moderate skin irritation can occur.  This can include redness, itchiness, or hives. If this occurs, immediately remove tape and consult your primary care physician if symptoms are severe or do not resolve within 2 days.  To remove tape safely, hold nearby skin with one hand and gentle roll tape down with other hand.  You can apply oil or conditioner to tape while in shower prior to removal to loosen adhesive.  DO NOT swiftly rip tape off like a band-aid, as this could cause skin tears and additional skin irritation.    *ball squeeze *rubber band stretch * gentle massage towards the heart * squeeze wash cloth between fingers.  * squeeze clothes pin

## 2019-12-29 NOTE — Therapy (Addendum)
[place], [place], [address] Phone: [phone]   Fax:  [phone]  Physical Therapy Treatment/Discharge  Patient Details  Name: Katrina Cole MRN: [id number] Date of Birth: 02-17-1966 Referring Provider (PT): Silverio Decamp, MD   Encounter Date: 12/29/2019   PT End of Session - 12/29/19 1006    Visit Number 2    Number of Visits 6    Date for PT Re-Evaluation 01/26/20    PT Start Time 0935    PT Stop Time 1003    PT Time Calculation (min) 28 min    Activity Tolerance Patient tolerated treatment well;No increased pain    Behavior During Therapy WFL for tasks assessed/performed           Past Medical History:  Diagnosis Date  . DDD (degenerative disc disease), lumbar   . GERD (gastroesophageal reflux disease)     Past Surgical History:  Procedure Laterality Date  . ARTHROSCOPIC REPAIR ACL    . CARPAL TUNNEL RELEASE Bilateral 2019,2020  . CESAREAN SECTION    . TRACHEOSTOMY      There were no vitals filed for this visit.   Subjective Assessment - 12/29/19 0936    Subjective "My finger is much much better".   95% improvement since starting therapy. She notices a tightness across top of knuckle.    Patient Stated Goals Return to work and be able to lift and grasp as normal    Currently in Pain? Yes    Pain Score 6     Pain Location Toe, Rt lateral foot - sore from fx.   (Not being addressed at this visit)              [facility] PT Assessment - 12/29/19 0001      Assessment   Medical Diagnosis S62.662D (ICD-10-CM) - Closed nondisplaced fracture of distal phalanx of right middle finger with routine healing, subsequent encounter    Referring Provider (PT) Silverio Decamp, MD    Onset Date/Surgical Date 10/27/19    Hand Dominance Right    Next MD Visit --    Prior Therapy None      Observation/Other Assessments   Focus on Therapeutic Outcomes (FOTO)  12% limitation        PROM   Overall PROM Comments Rt middle finger MCP 85 deg, PIP 100 deg, DIP 78 deg      Strength   Right Hand Grip (lbs) 94    Right Hand Lateral Pinch 13 lbs    Right Hand 3 Point Pinch 11 lbs    Left Hand Grip (lbs) 89    Left Hand Lateral Pinch 11.5 lbs    Left Hand 3 Point Pinch 11 lbs             [facility] Adult PT Treatment/Exercise - 12/29/19 0001      Hand Exercises   Opposition Limitations reviewed clothes pin and ball squeezes for opposition     Digit Abduction/Adduction Rt finger adduction with wash cloth x 10 reps (between first/second and second/third fingers)    Rubberbands 5 reps with double rubber bands on Rt hand    Other Hand Exercises light and med pro hand x 10 reps on Rt hand.  med handmaster finger ext and ball squeeze x 15 reps       Manual Therapy   Manual Therapy Soft tissue mobilization;Passive ROM;Taping    Manual therapy comments gentle lymphatic massage to Rt ring finger to encourage  edema reduction     Soft tissue mobilization gentle STM along medial lateral edge of Rt DIP/PIP joint     Passive ROM Rt PIP, DIP, MCP     Kinesiotex Edema      Kinesiotix   Edema Reg rock tape applied to Rt ring finger (with no stretch) to assist with edema reduction.                    PT Education - 12/29/19 1253    Education Details ktape info; HEP    Person(s) Educated Patient    Methods Explanation;Handout    Comprehension Verbalized understanding            PT Short Term Goals - 12/15/19 1042      PT SHORT TERM GOAL #1   Title Pt will be able to return to work without issues holding and grasping    Time 3    Period Weeks    Status Unable to assess - out of work due to recent foot injury    Target Date 01/05/20             PT Long Term Goals - 12/29/19 1020      PT LONG TERM GOAL #1   Title Pt will be independent with her HEP    Time 6    Period Weeks    Status Achieved      PT LONG TERM GOAL #2   Title Pt will improve R hand  grip to 80 lbs to be equal with L hand    Time 6    Period Weeks    Status Achieved      PT LONG TERM GOAL #3   Title Pt will improve R finger ROM to be symmetrical with L finger    Baseline --    Time 6    Period Weeks    Status Not Met      PT LONG TERM GOAL #4   Title FOTO score will improve to </=22% impairment    Time 6    Period Weeks    Status Achieved             PHYSICAL THERAPY DISCHARGE SUMMARY  Visits from Start of Care: 1  Current functional level related to goals / functional outcomes: 12% impairment on FOTO   Remaining deficits: Edema limiting ROM in finger   Education / Equipment: See treatment above  Plan: Patient agrees to discharge.  Patient goals were partially met. Patient is being discharged due to being pleased with the current functional level.  ?????          Plan - 12/29/19 1243    Clinical Impression Statement Pt demonstrated improved Rt finger ROM, but not equal to Lt hand.  Pt tolerated all execises well, without increase in pain.  FOTO improved.  Pt has met all but her ROM goals. Pt verbalized readiness to d/c to HEP; pleased with current level of function.    Personal Factors and Comorbidities Time since onset of injury/illness/exacerbation;Profession    Examination-Activity Limitations Lift;Carry    Examination-Participation Restrictions Cleaning;Community Activity;Other   working at [employer] Making Stable/Uncomplicated    Rehab Potential Good    PT Frequency 1x / week    PT Duration 6 weeks    PT Treatment/Interventions ADLs/Self Care Home Management;Cryotherapy;Ultrasound;Therapeutic activities;Functional mobility training;Therapeutic exercise;Neuromuscular re-education;Patient/family education;Manual techniques;Passive range of motion    PT Next Visit Plan spoke to supervising PT; will  d/c at this time.    PT Home Exercise Plan [id number]    Consulted and Agree with Plan of Care Patient             Patient will benefit from skilled therapeutic intervention in order to improve the following deficits and impairments:  Hypomobility, Decreased mobility, Impaired sensation, Decreased range of motion, Increased edema, Decreased strength, Increased fascial restricitons, Impaired flexibility, Impaired UE functional use  Visit Diagnosis: Stiffness of right hand, not elsewhere classified  Pain in joints of right hand  Muscle weakness (generalized)  Localized edema     Problem List Patient Active Problem List   Diagnosis Date Noted  . Right foot injury 12/27/2019  . Fracture of distal phalanx of right middle finger 11/01/2019   [facility] April Gordy Levan, [place], DPT 01/01/2020 9:53 AM  Kerin Perna, PTA 12/29/19 12:54 PM  [place], [place], [address] Phone: [phone]   Fax:  [phone]  Name: Stephania Macfarlane MRN: [id number] Date of Birth: 1966-01-14

## 2019-12-31 ENCOUNTER — Other Ambulatory Visit: Payer: Self-pay | Admitting: Medical-Surgical

## 2020-01-03 ENCOUNTER — Ambulatory Visit (INDEPENDENT_AMBULATORY_CARE_PROVIDER_SITE_OTHER): Payer: BLUE CROSS/BLUE SHIELD | Admitting: Sports Medicine

## 2020-01-03 ENCOUNTER — Ambulatory Visit (INDEPENDENT_AMBULATORY_CARE_PROVIDER_SITE_OTHER): Payer: BLUE CROSS/BLUE SHIELD

## 2020-01-03 ENCOUNTER — Other Ambulatory Visit: Payer: Self-pay

## 2020-01-03 ENCOUNTER — Encounter: Payer: Self-pay | Admitting: Sports Medicine

## 2020-01-03 DIAGNOSIS — S99921D Unspecified injury of right foot, subsequent encounter: Secondary | ICD-10-CM | POA: Diagnosis not present

## 2020-01-03 DIAGNOSIS — S62662D Nondisplaced fracture of distal phalanx of right middle finger, subsequent encounter for fracture with routine healing: Secondary | ICD-10-CM

## 2020-01-03 NOTE — Assessment & Plan Note (Signed)
Full motion, full strength, no further intervention needed.

## 2020-01-03 NOTE — Progress Notes (Signed)
    Procedures performed today:    None.  Independent interpretation of notes and tests performed by another provider:   None.  Brief History, Exam, Impression, and Recommendations:    Right foot injury Was doing extremely well, very minimal pain over 1/5 toe fracture, she kicked another object 2 days ago and has recurrence of pain. Repeating x-rays, continue postop shoe. Plan is to return to work early July, I would like to see her the day before to reevaluate and clear her.  Fracture of distal phalanx of right middle finger Full motion, full strength, no further intervention needed.    ___________________________________________ Ihor Austin. Benjamin Stain, M.D., ABFM., CAQSM. Primary Care and Sports Medicine [facility]  Adjunct Instructor of Family Medicine  [employer]

## 2020-01-03 NOTE — Assessment & Plan Note (Signed)
Was doing extremely well, very minimal pain over 1/5 toe fracture, she kicked another object 2 days ago and has recurrence of pain. Repeating x-rays, continue postop shoe. Plan is to return to work early July, I would like to see her the day before to reevaluate and clear her.

## 2020-01-10 ENCOUNTER — Ambulatory Visit (INDEPENDENT_AMBULATORY_CARE_PROVIDER_SITE_OTHER): Payer: BLUE CROSS/BLUE SHIELD | Admitting: Sports Medicine

## 2020-01-10 ENCOUNTER — Other Ambulatory Visit: Payer: Self-pay

## 2020-01-10 DIAGNOSIS — S99921D Unspecified injury of right foot, subsequent encounter: Secondary | ICD-10-CM

## 2020-01-10 NOTE — Assessment & Plan Note (Signed)
Katrina Cole returns, she sustained a right small toe proximal phalangeal fracture about 2 weeks ago, I think she can wear the postop shoe for another week, then return to work. She only has minimal tenderness over the fracture. Her postop shoe did break, it was defective, and so we have exchanged it for a new one.

## 2020-01-10 NOTE — Progress Notes (Signed)
    Procedures performed today:    None.  Independent interpretation of notes and tests performed by another provider:   None.  Brief History, Exam, Impression, and Recommendations:    Right foot injury Bird returns, she sustained a right small toe proximal phalangeal fracture about 2 weeks ago, I think she can wear the postop shoe for another week, then return to work. She only has minimal tenderness over the fracture. Her postop shoe did break, it was defective, and so we have exchanged it for a new one.    ___________________________________________ Ihor Austin. Benjamin Stain, M.D., ABFM., CAQSM. Primary Care and Sports Medicine [facility]  Adjunct Instructor of Family Medicine  [employer]

## 2020-01-17 ENCOUNTER — Encounter: Payer: Self-pay | Admitting: Sports Medicine

## 2020-01-17 ENCOUNTER — Other Ambulatory Visit: Payer: Self-pay

## 2020-01-17 ENCOUNTER — Ambulatory Visit (INDEPENDENT_AMBULATORY_CARE_PROVIDER_SITE_OTHER): Payer: BLUE CROSS/BLUE SHIELD | Admitting: Sports Medicine

## 2020-01-17 DIAGNOSIS — M5136 Other intervertebral disc degeneration, lumbar region: Secondary | ICD-10-CM

## 2020-01-17 DIAGNOSIS — S99921D Unspecified injury of right foot, subsequent encounter: Secondary | ICD-10-CM

## 2020-01-17 DIAGNOSIS — M51369 Other intervertebral disc degeneration, lumbar region without mention of lumbar back pain or lower extremity pain: Secondary | ICD-10-CM | POA: Insufficient documentation

## 2020-01-17 MED ORDER — METHYLPREDNISOLONE 4 MG PO TBPK: ORAL_TABLET | ORAL | 0 refills | Status: DC

## 2020-01-17 NOTE — Assessment & Plan Note (Signed)
Katrina Cole returns, she is doing a lot better, she is approximately 3 weeks post fracture of her right fifth toe proximal phalanx. She is able to wear a regular shoe now, she will be returning to work sometime next week. We filled out her short-term disability form, she has a return to work letter, return to see me as needed for this.

## 2020-01-17 NOTE — Assessment & Plan Note (Signed)
Katrina Cole endorses a long history of low back pain, she sustained the most recent episode recently when trying to pick up her child. She has axial pain, discogenic, nothing radicular, no bowel or bladder dysfunction, in the past a 6-day methylprednisolone taper has worked well, happy to call this in again, she understands that it may slightly delay the healing of her toe fracture. Return as needed for this.

## 2020-01-17 NOTE — Progress Notes (Signed)
    Procedures performed today:    None.  Independent interpretation of notes and tests performed by another provider:   None.  Brief History, Exam, Impression, and Recommendations:    Right foot injury Katrina Cole returns, she is doing a lot better, she is approximately 3 weeks post fracture of her right fifth toe proximal phalanx. She is able to wear a regular shoe now, she will be returning to work sometime next week. We filled out her short-term disability form, she has a return to work letter, return to see me as needed for this.  Lumbar degenerative disc disease Katrina Cole endorses a long history of low back pain, she sustained the most recent episode recently when trying to pick up her child. She has axial pain, discogenic, nothing radicular, no bowel or bladder dysfunction, in the past a 6-day methylprednisolone taper has worked well, happy to call this in again, she understands that it may slightly delay the healing of her toe fracture. Return as needed for this.    ___________________________________________ Ihor Austin. Benjamin Stain, M.D., ABFM., CAQSM. Primary Care and Sports Medicine [facility] [facility]  Adjunct Instructor of Family Medicine  [employer] [facility] [employer]

## 2020-01-19 ENCOUNTER — Ambulatory Visit: Payer: BLUE CROSS/BLUE SHIELD | Admitting: Sports Medicine

## 2020-01-22 ENCOUNTER — Encounter: Payer: Self-pay | Admitting: Obstetrics and Gynecology

## 2020-01-22 ENCOUNTER — Other Ambulatory Visit: Payer: Self-pay

## 2020-01-22 ENCOUNTER — Other Ambulatory Visit (HOSPITAL_COMMUNITY)
Admission: RE | Admit: 2020-01-22 | Discharge: 2020-01-22 | Disposition: A | Discharge status: Home / Self Care | Payer: BLUE CROSS/BLUE SHIELD | Source: Ambulatory Visit | Attending: Obstetrics and Gynecology | Admitting: Obstetrics and Gynecology

## 2020-01-22 ENCOUNTER — Ambulatory Visit (INDEPENDENT_AMBULATORY_CARE_PROVIDER_SITE_OTHER): Payer: BLUE CROSS/BLUE SHIELD | Admitting: Obstetrics and Gynecology

## 2020-01-22 VITALS — BP 136/89 | HR 100 | Resp 16 | Ht 68.0 in | Wt 215.0 lb

## 2020-01-22 DIAGNOSIS — N816 Rectocele: Secondary | ICD-10-CM | POA: Diagnosis not present

## 2020-01-22 DIAGNOSIS — Z124 Encounter for screening for malignant neoplasm of cervix: Secondary | ICD-10-CM | POA: Diagnosis not present

## 2020-01-22 DIAGNOSIS — Z01419 Encounter for gynecological examination (general) (routine) without abnormal findings: Secondary | ICD-10-CM

## 2020-01-22 DIAGNOSIS — Z1231 Encounter for screening mammogram for malignant neoplasm of breast: Secondary | ICD-10-CM

## 2020-01-22 NOTE — Progress Notes (Signed)
GYNECOLOGY ANNUAL PREVENTATIVE CARE ENCOUNTER NOTE  Subjective:   Katrina Cole is a 54 y.o. G5P4 female here for a annual gynecologic exam. Current complaints: none.    Denies abnormal vaginal bleeding, discharge, pelvic pain, problems with intercourse or other gynecologic concerns. Not currently sexually active. Does report occasional prolapse of posterior vaginal wall and difficulty with stooling. Occasionally has to reduce wall to have BM. Denies urinary issues.    Gynecologic History No LMP recorded (lmp unknown). Patient is postmenopausal. Contraception: post menopausal status Last Pap: 10+ years, normal Last mammogram: has never had DEXA: has never had  Obstetric History OB History  Gravida Para Term Preterm AB Living  5 4       4   SAB TAB Ectopic Multiple Live Births          4    # Outcome Date GA Lbr Len/2nd Weight Sex Delivery Anes PTL Lv  5 Gravida           4 Para           3 Para           2 Para           1 Para             Past Medical History:  Diagnosis Date  . DDD (degenerative disc disease), lumbar   . GERD (gastroesophageal reflux disease)     Past Surgical History:  Procedure Laterality Date  . ARTHROSCOPIC REPAIR ACL    . CARPAL TUNNEL RELEASE Bilateral 2019,2020  . CESAREAN SECTION    . DILATION AND CURETTAGE OF UTERUS    . TRACHEOSTOMY    . uterine ablation      Current Outpatient Medications on File Prior to Visit  Medication Sig Dispense Refill  . Calcium Carbonate-Vitamin D 600-400 MG-UNIT tablet Take 1 tablet by mouth 2 (two) times daily. 60 tablet 11  . cetirizine (ZYRTEC) 10 MG tablet Take 1 tablet (10 mg total) by mouth daily. 30 tablet 11  . fluticasone (FLONASE) 50 MCG/ACT nasal spray SPRAY 2 SPRAYS INTO EACH NOSTRIL EVERY DAY 16 mL 4  . loratadine (CLARITIN) 10 MG tablet Take 10 mg by mouth daily.    01-30[birth year]1983 omeprazole (PRILOSEC) 40 MG capsule Take 1 capsule (40 mg total) by mouth daily. 90 capsule 3   No current  facility-administered medications on file prior to visit.    Allergies  Allergen Reactions  . Penicillins Anaphylaxis    Social History   Socioeconomic History  . Marital status: Legally Separated    Spouse name: Not on file  . Number of children: Not on file  . Years of education: Not on file  . Highest education level: Not on file  Occupational History  . Not on file  Tobacco Use  . Smoking status: Former Smoker    Quit date: 2008    Years since quitting: 13.5  . Smokeless tobacco: Never Used  Vaping Use  . Vaping Use: Never used  Substance and Sexual Activity  . Alcohol use: Yes    Alcohol/week: 2.0 - 3.0 standard drinks    Types: 2 - 3 Cans of beer per week  . Drug use: Never  . Sexual activity: Not Currently    Birth control/protection: None  Other Topics Concern  . Not on file  Social History Narrative  . Not on file   Social Determinants of Health   Financial Resource Strain:   . Difficulty of Paying Living Expenses:  Food Insecurity:   . Worried About Programme researcher, broadcasting/film/video in the Last Year:   . Barista in the Last Year:   Transportation Needs:   . Freight forwarder (Medical):   Marland Kitchen Lack of Transportation (Non-Medical):   Physical Activity:   . Days of Exercise per Week:   . Minutes of Exercise per Session:   Stress:   . Feeling of Stress :   Social Connections:   . Frequency of Communication with Friends and Family:   . Frequency of Social Gatherings with Friends and Family:   . Attends Religious Services:   . Active Member of Clubs or Organizations:   . Attends Banker Meetings:   Marland Kitchen Marital Status:   Intimate Partner Violence:   . Fear of Current or Ex-Partner:   . Emotionally Abused:   Marland Kitchen Physically Abused:   . Sexually Abused:     Family History  Problem Relation Age of Onset  . COPD Mother   . Hypertension Father    The following portions of the patient's history were reviewed and updated as appropriate:  allergies, current medications, past family history, past medical history, past social history, past surgical history and problem list.  Review of Systems Pertinent items are noted in HPI.   Objective:  BP 136/89   Pulse 100   Resp 16   Ht 5\' 8"  (1.727 m)   Wt 215 lb (97.5 kg)   LMP  (LMP Unknown)   BMI 32.69 kg/m  CONSTITUTIONAL: Well-developed, well-nourished female in no acute distress.  HENT:  Normocephalic, atraumatic, External right and left ear normal. Oropharynx is clear and moist EYES: Conjunctivae and EOM are normal. Pupils are equal, round, and reactive to light. No scleral icterus.  NECK: Normal range of motion, supple, no masses.  Normal thyroid.  SKIN: Skin is warm and dry. No rash noted. Not diaphoretic. No erythema. No pallor. NEUROLOGIC: Alert and oriented to person, place, and time. Normal reflexes, muscle tone coordination. No cranial nerve deficit noted. PSYCHIATRIC: Normal mood and affect. Normal behavior. Normal judgment and thought content. CARDIOVASCULAR: Normal heart rate noted, regular rhythm RESPIRATORY: Clear to auscultation bilaterally. Effort and breath sounds normal, no problems with respiration noted. BREASTS: Symmetric in size. No masses, skin changes, nipple drainage, or lymphadenopathy. ABDOMEN: Soft, no distention noted.  No tenderness, rebound or guarding.  PELVIC: Normal appearing external genitalia; normal appearing vaginal mucosa and cervix.  No abnormal discharge noted. Moderate posterior wall prolapse.  Pap smear obtained. Normal uterine size, no other palpable masses, no uterine or adnexal tenderness. MUSCULOSKELETAL: Normal range of motion. No tenderness.  No cyanosis, clubbing, or edema.  2+ distal pulses.  Exam done with chaperone present.   Assessment and Plan:   1. Well woman exam No issues  2. Encounter for screening mammogram for malignant neoplasm of breast - [place] Digital Screening; Future  3. Cervical cancer screening -  Cytology - PAP( [place])  4. Posterior vaginal wall prolapse Moderate prolapse She has dealt with this for 20 years Reviewed options for management, specifically surgical Offered referral to Urogyn, she will consider and call if she desires referral   Will follow up results of pap smear and manage accordingly. Encouraged improvement in diet and exercise.  Mammogram ordered today Referral for colonoscopy placed by PCP  Routine preventative health maintenance measures emphasized. Please refer to After Visit Summary for other counseling recommendations.   Total face-to-face time with patient: 30 minutes. Over 50% of encounter  was spent on counseling and coordination of care.   Baldemar Lenis, M.D. Attending Center for [employer] [facility])

## 2020-01-23 ENCOUNTER — Telehealth: Payer: Self-pay | Admitting: *Deleted

## 2020-01-23 LAB — CYTOLOGY - PAP
Comment: NEGATIVE
Diagnosis: NEGATIVE
High risk HPV: NEGATIVE

## 2020-01-23 NOTE — Telephone Encounter (Signed)
-----   Message from Conan Bowens, MD sent at 01/23/2020  3:40 PM EDT ----- Normal pap, repeat 3 years Please call and let pt know

## 2020-01-23 NOTE — Telephone Encounter (Signed)
Left patient a message with message from Dr. Earlene Plater and to call the office if she has any questions.

## 2020-01-24 ENCOUNTER — Ambulatory Visit: Payer: BLUE CROSS/BLUE SHIELD | Admitting: Sports Medicine

## 2020-01-31 ENCOUNTER — Ambulatory Visit (INDEPENDENT_AMBULATORY_CARE_PROVIDER_SITE_OTHER): Payer: BLUE CROSS/BLUE SHIELD

## 2020-01-31 ENCOUNTER — Other Ambulatory Visit: Payer: Self-pay

## 2020-01-31 DIAGNOSIS — Z1231 Encounter for screening mammogram for malignant neoplasm of breast: Secondary | ICD-10-CM | POA: Diagnosis not present

## 2020-02-01 ENCOUNTER — Other Ambulatory Visit: Payer: Self-pay | Admitting: Medical-Surgical

## 2020-02-01 DIAGNOSIS — R928 Other abnormal and inconclusive findings on diagnostic imaging of breast: Secondary | ICD-10-CM

## 2020-02-02 ENCOUNTER — Other Ambulatory Visit: Payer: Self-pay | Admitting: Medical-Surgical

## 2020-02-02 DIAGNOSIS — R928 Other abnormal and inconclusive findings on diagnostic imaging of breast: Secondary | ICD-10-CM

## 2020-03-15 ENCOUNTER — Ambulatory Visit: Payer: BLUE CROSS/BLUE SHIELD | Admitting: Medical-Surgical

## 2020-05-03 ENCOUNTER — Ambulatory Visit (INDEPENDENT_AMBULATORY_CARE_PROVIDER_SITE_OTHER): Payer: BLUE CROSS/BLUE SHIELD | Admitting: Sports Medicine

## 2020-05-03 ENCOUNTER — Other Ambulatory Visit: Payer: Self-pay

## 2020-05-03 DIAGNOSIS — M76822 Posterior tibial tendinitis, left leg: Secondary | ICD-10-CM | POA: Insufficient documentation

## 2020-05-03 DIAGNOSIS — M5136 Other intervertebral disc degeneration, lumbar region: Secondary | ICD-10-CM | POA: Diagnosis not present

## 2020-05-03 DIAGNOSIS — M51369 Other intervertebral disc degeneration, lumbar region without mention of lumbar back pain or lower extremity pain: Secondary | ICD-10-CM

## 2020-05-03 MED ORDER — MELOXICAM 15 MG PO TABS: ORAL_TABLET | ORAL | 3 refills | Status: DC

## 2020-05-03 MED ORDER — PREDNISONE 50 MG PO TABS: ORAL_TABLET | ORAL | 0 refills | Status: DC

## 2020-05-03 NOTE — Assessment & Plan Note (Signed)
Adalaya also throughout her back, pain is typically axial, discogenic without radiculitis, previously we did a methylprednisolone taper, we are going to try prednisone this time which I think will work a little better, if she does not get good improvement with her prednisone we can send in the methylprednisolone taper for 6 days afterwards. Return as needed for this.

## 2020-05-03 NOTE — Progress Notes (Addendum)
    Procedures performed today:    None.  Independent interpretation of notes and tests performed by another provider:   None.  Brief History, Exam, Impression, and Recommendations:    Tibialis posterior tendinitis, left This is a pleasant 54 year old female, for the past 3 months she has had pain behind her medial malleolus, worse with weightbearing, waking her from sleep. On exam she has swelling behind the medial malleolus, she has significant weakness and pain with inversion. Clinically this represents tibialis posterior tendinitis, because of duration of pain we are going to add a cam boot with 2 large scaphoid pads, she will continue Tylenol, adding meloxicam, home rehab exercises. Holding off on x-rays for now per patient request. Return to see me in approximately a month. I am going to take her out of work tomorrow, Sunday, and Monday, she returns on Wednesday as she already has Tuesday off.  Lumbar degenerative disc disease Katrina Cole also throughout her back, pain is typically axial, discogenic without radiculitis, previously we did a methylprednisolone taper, we are going to try prednisone this time which I think will work a little better, if she does not get good improvement with her prednisone we can send in the methylprednisolone taper for 6 days afterwards. Return as needed for this.    ___________________________________________ Ihor Austin. Benjamin Stain, M.D., ABFM., CAQSM. Primary Care and Sports Medicine [place] [facility]  Adjunct Instructor of Family Medicine  [employer]

## 2020-05-03 NOTE — Addendum Note (Signed)
Addended by: Monica Becton on: 05/03/2020 04:26 PM   Modules accepted: Orders

## 2020-05-03 NOTE — Assessment & Plan Note (Addendum)
This is a pleasant 54 year old female, for the past 3 months she has had pain behind her medial malleolus, worse with weightbearing, waking her from sleep. On exam she has swelling behind the medial malleolus, she has significant weakness and pain with inversion. Clinically this represents tibialis posterior tendinitis, because of duration of pain we are going to add a cam boot with 2 large scaphoid pads, she will continue Tylenol, adding meloxicam, home rehab exercises. Holding off on x-rays for now per patient request. Return to see me in approximately a month. I am going to take her out of work tomorrow, Sunday, and Monday, she returns on Wednesday as she already has Tuesday off.

## 2020-05-31 ENCOUNTER — Ambulatory Visit: Payer: BLUE CROSS/BLUE SHIELD | Admitting: Sports Medicine

## 2020-06-27 ENCOUNTER — Other Ambulatory Visit: Payer: Self-pay

## 2020-06-27 ENCOUNTER — Emergency Department
Admission: EM | Admit: 2020-06-27 | Discharge: 2020-06-27 | Disposition: A | Discharge status: Home / Self Care | Payer: BLUE CROSS/BLUE SHIELD | Source: Home / Self Care

## 2020-06-27 ENCOUNTER — Encounter: Payer: Self-pay | Admitting: Emergency Medicine

## 2020-06-27 DIAGNOSIS — B9689 Other specified bacterial agents as the cause of diseases classified elsewhere: Secondary | ICD-10-CM

## 2020-06-27 DIAGNOSIS — J019 Acute sinusitis, unspecified: Secondary | ICD-10-CM | POA: Diagnosis not present

## 2020-06-27 MED ORDER — DOXYCYCLINE HYCLATE 100 MG PO CAPS: 100.0000 mg | ORAL_CAPSULE | Freq: Two times a day (BID) | ORAL | 0 refills | Status: AC

## 2020-06-27 MED ORDER — FLUTICASONE PROPIONATE 50 MCG/ACT NA SUSP: 2.0000 | Freq: Every day | NASAL | 2 refills | Status: DC

## 2020-06-27 NOTE — ED Provider Notes (Signed)
[facility]    CSN: [id number] Arrival date & time: 06/27/20  8841      History   Chief Complaint Chief Complaint  Patient presents with  . Facial Pain    HPI Katrina Cole is a 54 y.o. female.   HPI  Katrina Cole is a 54 y.o. female presenting to UC with c/o 5 days of nasal congestion, sore throat and worsening facial pain with bilateral ear pain, worse in Right ear. Reports low-grade fever. Denies n/v/d. No cough or chest pain. No known sick contacts. Hx of sinus infections in the past. J&J COVID vaccine in April 2021, no flu vaccine.    Past Medical History:  Diagnosis Date  . DDD (degenerative disc disease), lumbar   . GERD (gastroesophageal reflux disease)     Patient Active Problem List   Diagnosis Date Noted  . Tibialis posterior tendinitis, left 05/03/2020  . Posterior vaginal wall prolapse 01/22/2020  . Lumbar degenerative disc disease 01/17/2020  . Right foot injury 12/27/2019  . Fracture of distal phalanx of right middle finger 11/01/2019    Past Surgical History:  Procedure Laterality Date  . ARTHROSCOPIC REPAIR ACL    . CARPAL TUNNEL RELEASE Bilateral 2019,2020  . CESAREAN SECTION    . DILATION AND CURETTAGE OF UTERUS    . TRACHEOSTOMY    . uterine ablation      OB History    Gravida  5   Para  4   Term      Preterm      AB      Living  4     SAB      IAB      Ectopic      Multiple      Live Births  4            Home Medications    Prior to Admission medications   Medication Sig Start Date End Date Taking? Authorizing Provider  doxycycline (VIBRAMYCIN) 100 MG capsule Take 1 capsule (100 mg total) by mouth 2 (two) times daily for 7 days. 06/27/20 07/04/20  Lurene Shadow, PA-C  fluticasone (FLONASE) 50 MCG/ACT nasal spray Place 2 sprays into both nostrils daily. 06/27/20   Lurene Shadow, PA-C  omeprazole (PRILOSEC) 40 MG capsule Take 1 capsule (40 mg total) by mouth daily. 12/05/19   Christen Butter, NP     Family History Family History  Problem Relation Age of Onset  . COPD Mother   . Hypertension Father     Social History Social History   Tobacco Use  . Smoking status: Former Smoker    Quit date: 2008    Years since quitting: 13.9  . Smokeless tobacco: Never Used  Vaping Use  . Vaping Use: Never used  Substance Use Topics  . Alcohol use: Yes    Alcohol/week: 2.0 - 3.0 standard drinks    Types: 2 - 3 Cans of beer per week  . Drug use: Never     Allergies   Penicillins   Review of Systems Review of Systems  Constitutional: Negative for chills and fever.  HENT: Positive for congestion, sinus pressure and sinus pain. Negative for ear pain, sore throat, trouble swallowing and voice change.   Respiratory: Negative for cough and shortness of breath.   Cardiovascular: Negative for chest pain and palpitations.  Gastrointestinal: Negative for abdominal pain, diarrhea, nausea and vomiting.  Musculoskeletal: Negative for arthralgias, back pain and myalgias.  Skin: Negative for rash.  Neurological:  Positive for headaches (frontal). Negative for dizziness and light-headedness.  All other systems reviewed and are negative.    Physical Exam Triage Vital Signs ED Triage Vitals  Enc Vitals Group     BP 06/27/20 0943 (!) 143/92     Pulse Rate 06/27/20 0943 84     Resp --      Temp 06/27/20 0943 98.6 F (37 C)     Temp Source 06/27/20 0943 Oral     SpO2 06/27/20 0943 96 %     Weight 06/27/20 0944 220 lb (99.8 kg)     Height 06/27/20 0944 5\' 8"  (1.727 m)     Head Circumference --      Peak Flow --      Pain Score 06/27/20 0944 8     Pain Loc --      Pain Edu? --      Excl. in GC? --    No data found.  Updated Vital Signs BP (!) 143/92 (BP Location: Right Arm)   Pulse 84   Temp 98.6 F (37 C) (Oral)   Ht 5\' 8"  (1.727 m)   Wt 220 lb (99.8 kg)   LMP  (LMP Unknown)   SpO2 96%   BMI 33.45 kg/m   Visual Acuity Right Eye Distance:   Left Eye Distance:    Bilateral Distance:    Right Eye Near:   Left Eye Near:    Bilateral Near:     Physical Exam Vitals and nursing note reviewed.  Constitutional:      General: She is not in acute distress.    Appearance: Normal appearance. She is well-developed and well-nourished. She is not ill-appearing, toxic-appearing or diaphoretic.  HENT:     Head: Normocephalic and atraumatic.     Right Ear: Tympanic membrane and ear canal normal.     Left Ear: Tympanic membrane and ear canal normal.     Nose: Congestion present.     Right Sinus: Maxillary sinus tenderness and frontal sinus tenderness present.     Left Sinus: Maxillary sinus tenderness and frontal sinus tenderness present.     Mouth/Throat:     Lips: Pink.     Mouth: Mucous membranes are moist.     Pharynx: Oropharynx is clear. Uvula midline. No pharyngeal swelling, oropharyngeal exudate, posterior oropharyngeal erythema or uvula swelling.  Eyes:     Extraocular Movements: EOM normal.  Cardiovascular:     Rate and Rhythm: Normal rate and regular rhythm.  Pulmonary:     Effort: Pulmonary effort is normal. No respiratory distress.     Breath sounds: Normal breath sounds. No stridor. No wheezing, rhonchi or rales.  Musculoskeletal:        General: Normal range of motion.     Cervical back: Normal range of motion.  Skin:    General: Skin is warm and dry.  Neurological:     Mental Status: She is alert and oriented to person, place, and time.  Psychiatric:        Mood and Affect: Mood and affect normal.        Behavior: Behavior normal.      UC Treatments / Results  Labs (all labs ordered are listed, but only abnormal results are displayed) Labs Reviewed - No data to display  EKG   Radiology No results found.  Procedures Procedures (including critical care time)  Medications Ordered in UC Medications - No data to display  Initial Impression / Assessment and Plan / UC Course  I have reviewed the triage vital signs and the  nursing notes.  Pertinent labs & imaging results that were available during my care of the patient were reviewed by me and considered in my medical decision making (see chart for details).     Sinus tenderness noted on exam, worsening over 1 week, will tx with doxycycline (throat swelling with PCN) Pt declined COVID/flu test today. F/u with PCP AVS given  Final Clinical Impressions(s) / UC Diagnoses   Final diagnoses:  Acute bacterial sinusitis     Discharge Instructions      You may take 500mg  acetaminophen every 4-6 hours or in combination with ibuprofen 400-600mg  every 6-8 hours as needed for pain, inflammation, and fever.  Be sure to well hydrated with clear liquids and get at least 8 hours of sleep at night, preferably more while sick.   Please follow up with family medicine in 1 week if needed.     ED Prescriptions    Medication Sig Dispense Auth. Provider   doxycycline (VIBRAMYCIN) 100 MG capsule Take 1 capsule (100 mg total) by mouth 2 (two) times daily for 7 days. 14 capsule Broc Caspers O, PA-C   fluticasone (FLONASE) 50 MCG/ACT nasal spray Place 2 sprays into both nostrils daily. 16 g , Lurene Shadow     PDMP not reviewed this encounter.   [place], PA-C 06/27/20 1000

## 2020-06-27 NOTE — ED Triage Notes (Signed)
Facial pain, pressure, headache, congestion x 5 days. Vaccinated

## 2020-06-27 NOTE — Discharge Instructions (Signed)
  You may take 500mg acetaminophen every 4-6 hours or in combination with ibuprofen 400-600mg every 6-8 hours as needed for pain, inflammation, and fever.  Be sure to well hydrated with clear liquids and get at least 8 hours of sleep at night, preferably more while sick.   Please follow up with family medicine in 1 week if needed.   

## 2020-07-03 ENCOUNTER — Encounter: Payer: Self-pay | Admitting: Medical-Surgical

## 2020-07-03 ENCOUNTER — Other Ambulatory Visit: Payer: Self-pay

## 2020-07-03 ENCOUNTER — Ambulatory Visit (INDEPENDENT_AMBULATORY_CARE_PROVIDER_SITE_OTHER): Payer: BLUE CROSS/BLUE SHIELD | Admitting: Medical-Surgical

## 2020-07-03 VITALS — BP 141/89 | HR 98 | Temp 99.3°F | Ht 68.0 in | Wt 228.6 lb

## 2020-07-03 DIAGNOSIS — F419 Anxiety disorder, unspecified: Secondary | ICD-10-CM

## 2020-07-03 DIAGNOSIS — J069 Acute upper respiratory infection, unspecified: Secondary | ICD-10-CM

## 2020-07-03 MED ORDER — HYDROXYZINE HCL 50 MG PO TABS: 25.0000 mg | ORAL_TABLET | Freq: Every evening | ORAL | 0 refills | Status: DC | PRN
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
## 2020-07-26 ENCOUNTER — Other Ambulatory Visit: Payer: Self-pay | Admitting: Medical-Surgical
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
## 2020-07-31 ENCOUNTER — Encounter: Payer: Self-pay | Admitting: Medical-Surgical

## 2020-07-31 ENCOUNTER — Telehealth (INDEPENDENT_AMBULATORY_CARE_PROVIDER_SITE_OTHER): Payer: BLUE CROSS/BLUE SHIELD | Admitting: Medical-Surgical

## 2020-07-31 DIAGNOSIS — G4700 Insomnia, unspecified: Secondary | ICD-10-CM

## 2020-07-31 DIAGNOSIS — F419 Anxiety disorder, unspecified: Secondary | ICD-10-CM | POA: Diagnosis not present

## 2020-08-02 ENCOUNTER — Telehealth: Payer: Self-pay

## 2020-08-02 MED ORDER — AMITRIPTYLINE HCL 25 MG PO TABS: ORAL_TABLET | ORAL | 0 refills | Status: DC

## 2020-08-02 NOTE — Telephone Encounter (Signed)
Medication sent. Please pass along my apologies for the delay.

## 2020-08-02 NOTE — Telephone Encounter (Signed)
LM on voicemail @ 3:58pm to let patient know her medication had been sent into the pharmacy.  If she had any further questions, to reach out to [place].

## 2020-08-02 NOTE — Telephone Encounter (Signed)
Pt called and LVM saying that she was supposed to start a new Rx as discussed during her 07/31/2020 VV but nothing was called in to the pharmacy. According to the note, she is supposed to start amitriptyline 25 mg at bedtime for 1 week the increase to 50 mg at bedtime. Rx has been tee'd up below and ready for review and approval/denial.

## 2020-08-16 ENCOUNTER — Other Ambulatory Visit: Payer: Self-pay | Admitting: Medical-Surgical

## 2020-10-09 ENCOUNTER — Telehealth (INDEPENDENT_AMBULATORY_CARE_PROVIDER_SITE_OTHER): Payer: BLUE CROSS/BLUE SHIELD | Admitting: Medical-Surgical

## 2020-10-09 ENCOUNTER — Encounter: Payer: Self-pay | Admitting: Medical-Surgical

## 2020-10-09 DIAGNOSIS — J0100 Acute maxillary sinusitis, unspecified: Secondary | ICD-10-CM | POA: Diagnosis not present

## 2020-10-09 MED ORDER — IPRATROPIUM BROMIDE 0.03 % NA SOLN: 2.0000 | Freq: Two times a day (BID) | NASAL | 0 refills | Status: DC

## 2020-10-09 MED ORDER — AZITHROMYCIN 250 MG PO TABS: ORAL_TABLET | ORAL | 0 refills | Status: DC

## 2020-10-09 NOTE — Progress Notes (Signed)
Virtual Visit via Video Note  I connected with Katrina Cole on 10/09/20 at  1:00 PM EDT by a video enabled telemedicine application and verified that I am speaking with the correct person using two identifiers.   I discussed the limitations of evaluation and management by telemedicine and the availability of in person appointments. The patient expressed understanding and agreed to proceed.  Patient location: home Provider locations: office  Subjective:    CC: Sinus symptoms  HPI: Pleasant 55 year old female presenting today with reports of allergies that started approximately 2 weeks ago.  She did start taking her allergy medication when she noticed the first bit of congestion.  Unfortunately approximately 3 days ago, her symptoms worsened and she developed severe eye pressure, maxillary facial pain, dental pain, postnasal drip with yellowish-green nasal discharge.  She is coughing occasionally.  Denies fever, chills, shortness of breath, chest pain, and GI symptoms.  She had Covid at the end of January and has not tested recently.  Past medical history, Surgical history, Family history not pertinant except as noted below, Social history, Allergies, and medications have been entered into the medical record, reviewed, and corrections made.   Review of Systems: See HPI for pertinent positives and negatives.   Objective:    General: Speaking clearly in complete sentences without any shortness of breath.  Alert and oriented x3.  Normal judgment. No apparent acute distress.  Impression and Recommendations:    1. Acute non-recurrent maxillary sinusitis Azithromycin 500 mg on day 1 followed by 250 mg daily for 4 days.  Atrovent nasal spray every 12 hours as needed for rhinorrhea.  Increase fluids.  Consider taking Mucinex twice daily.  Okay to use over-the-counter cold and flu preparations for symptom management.  Tylenol/ibuprofen for discomfort.  Consider nasal saline spray/rinses to help  loosen secretions.  I discussed the assessment and treatment plan with the patient. The patient was provided an opportunity to ask questions and all were answered. The patient agreed with the plan and demonstrated an understanding of the instructions.   The patient was advised to call back or seek an in-person evaluation if the symptoms worsen or if the condition fails to improve as anticipated.  20 minutes of non-face-to-face time was provided during this encounter.  Return if symptoms worsen or fail to improve.  Thayer Ohm, DNP, APRN, FNP-BC [place] [facility] and Sports Medicine

## 2020-10-27 ENCOUNTER — Other Ambulatory Visit: Payer: Self-pay | Admitting: Medical-Surgical

## 2020-11-28 ENCOUNTER — Other Ambulatory Visit: Payer: Self-pay | Admitting: Medical-Surgical

## 2020-12-12 ENCOUNTER — Ambulatory Visit: Payer: BLUE CROSS/BLUE SHIELD

## 2020-12-12 NOTE — Progress Notes (Signed)
   Covid-19 Vaccination Clinic  Name:  Katrina Cole    MRN: [id number] DOB: 1965-11-02  12/12/2020  [name] was observed post Covid-19 immunization for 15 minutes without incident. She was provided with Vaccine Information Sheet and instruction to access the V-Safe system.   [name] was instructed to call 911 with any severe reactions post vaccine: Marland Kitchen Difficulty breathing  . Swelling of face and throat  . A fast heartbeat  . A bad rash all over body  . Dizziness and weakness

## 2020-12-16 ENCOUNTER — Ambulatory Visit (INDEPENDENT_AMBULATORY_CARE_PROVIDER_SITE_OTHER): Payer: BLUE CROSS/BLUE SHIELD

## 2020-12-16 ENCOUNTER — Ambulatory Visit: Payer: BLUE CROSS/BLUE SHIELD | Admitting: Sports Medicine

## 2020-12-16 ENCOUNTER — Other Ambulatory Visit: Payer: Self-pay

## 2020-12-16 ENCOUNTER — Other Ambulatory Visit (HOSPITAL_BASED_OUTPATIENT_CLINIC_OR_DEPARTMENT_OTHER): Payer: Self-pay

## 2020-12-16 DIAGNOSIS — M25562 Pain in left knee: Secondary | ICD-10-CM | POA: Diagnosis not present

## 2020-12-16 DIAGNOSIS — G8929 Other chronic pain: Secondary | ICD-10-CM | POA: Diagnosis not present

## 2020-12-16 DIAGNOSIS — Z09 Encounter for follow-up examination after completed treatment for conditions other than malignant neoplasm: Secondary | ICD-10-CM

## 2020-12-16 MED ORDER — PFIZER-BIONT COVID-19 VAC-TRIS 30 MCG/0.3ML IM SUSP
INTRAMUSCULAR | 0 refills | Status: DC
  Filled 2020-12-16: qty 0.3, 1d supply, fill #0

## 2020-12-16 MED ORDER — MELOXICAM 15 MG PO TABS: ORAL_TABLET | ORAL | 3 refills | Status: DC

## 2020-12-16 NOTE — Assessment & Plan Note (Signed)
Katrina Cole is a very pleasant 55 year old female, she is approximately 20 years post ACL reconstruction, she does have a palpable gap at her tibial tuberosity as well as distal patella which makes me think she had bone patellar tendon bone graft. Pain is at the distal pole of the patella consistent with patellar tendinitis, she also has some pain medially consistent with osteoarthritis. We will start conservatively, reaction knee brace, meloxicam, x-rays, home therapy. Return to see me in a month, injection +/- MRI if no better.

## 2020-12-16 NOTE — Progress Notes (Signed)
    Procedures performed today:    None.  Independent interpretation of notes and tests performed by another provider:   Knee x-rays personally reviewed, sequelae of left ACL reconstruction, she also has subchondral sclerosis, osteophytosis consistent with left/bilateral knee osteoarthritis.  Brief History, Exam, Impression, and Recommendations:    Chronic pain of left knee Katrina Cole is a very pleasant 55 year old female, she is approximately 20 years post ACL reconstruction, she does have a palpable gap at her tibial tuberosity as well as distal patella which makes me think she had bone patellar tendon bone graft. Pain is at the distal pole of the patella consistent with patellar tendinitis, she also has some pain medially consistent with osteoarthritis. We will start conservatively, reaction knee brace, meloxicam, x-rays, home therapy. Return to see me in a month, injection +/- MRI if no better.    ___________________________________________ Ihor Austin. Benjamin Stain, M.D., ABFM., CAQSM. Primary Care and Sports Medicine [place] [facility]  Adjunct Instructor of Family Medicine  [employer]

## 2020-12-31 ENCOUNTER — Other Ambulatory Visit: Payer: Self-pay | Admitting: Medical-Surgical

## 2021-01-14 ENCOUNTER — Other Ambulatory Visit: Payer: Self-pay | Admitting: Medical-Surgical

## 2021-01-15 ENCOUNTER — Ambulatory Visit: Payer: BLUE CROSS/BLUE SHIELD | Admitting: Sports Medicine

## 2021-03-15 ENCOUNTER — Emergency Department
Admission: EM | Admit: 2021-03-15 | Discharge: 2021-03-15 | Disposition: A | Discharge status: Home / Self Care | Payer: BLUE CROSS/BLUE SHIELD | Source: Home / Self Care | Attending: Family Medicine | Admitting: Family Medicine

## 2021-03-15 ENCOUNTER — Emergency Department (INDEPENDENT_AMBULATORY_CARE_PROVIDER_SITE_OTHER): Payer: BLUE CROSS/BLUE SHIELD

## 2021-03-15 ENCOUNTER — Other Ambulatory Visit: Payer: Self-pay

## 2021-03-15 DIAGNOSIS — M25471 Effusion, right ankle: Secondary | ICD-10-CM

## 2021-03-15 DIAGNOSIS — M79671 Pain in right foot: Secondary | ICD-10-CM

## 2021-03-15 DIAGNOSIS — M25571 Pain in right ankle and joints of right foot: Secondary | ICD-10-CM

## 2021-03-15 DIAGNOSIS — M25474 Effusion, right foot: Secondary | ICD-10-CM

## 2021-03-15 MED ORDER — PREDNISONE 10 MG (21) PO TBPK: ORAL_TABLET | Freq: Every day | ORAL | 0 refills | Status: DC

## 2021-03-15 NOTE — ED Provider Notes (Signed)
[facility]    CSN: [id number] Arrival date & time: 03/15/21  1208      History   Chief Complaint Chief Complaint  Patient presents with   Ankle Pain    Right Ankle    HPI Katrina Cole is a 55 y.o. female.   HPI  Patient states she said foot and ankle pain has been progressively worsening over the last couple of months.  She states she is having a lot of difficulty making it through her work shift.  Because of her pain she has been walking differently.  Now her heel is hurting when first it was just her midfoot.  Her ankle also hurts with weightbearing.  She has swelling around the lateral foot.  No trauma or injury.  No fall.  She does work on her feet a lot in [employer].  She states that she wears good shoe wear or sneakers.  She has had problems with her feet in the past.  Past Medical History:  Diagnosis Date   DDD (degenerative disc disease), lumbar    GERD (gastroesophageal reflux disease)     Patient Active Problem List   Diagnosis Date Noted   Chronic pain of left knee 12/16/2020   Anxiety 07/31/2020   Insomnia 07/31/2020   Tibialis posterior tendinitis, left 05/03/2020   Posterior vaginal wall prolapse 01/22/2020   Lumbar degenerative disc disease 01/17/2020   Right foot injury 12/27/2019   Fracture of distal phalanx of right middle finger 11/01/2019    Past Surgical History:  Procedure Laterality Date   ARTHROSCOPIC REPAIR ACL     CARPAL TUNNEL RELEASE Bilateral 2019,2020   CESAREAN SECTION     DILATION AND CURETTAGE OF UTERUS     TRACHEOSTOMY     uterine ablation      OB History     Gravida  5   Para  4   Term      Preterm      AB      Living  4      SAB      IAB      Ectopic      Multiple      Live Births  4            Home Medications    Prior to Admission medications   Medication Sig Start Date End Date Taking? Authorizing Provider  predniSONE (STERAPRED UNI-PAK 21 TAB) 10 MG (21) TBPK tablet  Take by mouth daily. tad 03/15/21  Yes Eustace Moore, MD  hydrOXYzine (ATARAX/VISTARIL) 50 MG tablet TAKE 0.5-1 TABLETS (25-50 MG TOTAL) BY MOUTH AT BEDTIME AS NEEDED. 10/28/20   Christen Butter, NP  ipratropium (ATROVENT) 0.03 % nasal spray Place 2 sprays into both nostrils every 12 (twelve) hours. 10/09/20   Christen Butter, NP  meloxicam (MOBIC) 15 MG tablet One tab PO qAM with a meal for 2 weeks, then daily prn pain. 12/16/20   Monica Becton, MD  omeprazole (PRILOSEC) 40 MG capsule Take 1 capsule (40 mg total) by mouth daily. **NO MORE REFILLS UNTIL PATIENT COMES IN FOR AN OFFICE VISIT** 12/31/20   Christen Butter, NP    Family History Family History  Problem Relation Age of Onset   COPD Mother    Hypertension Father     Social History Social History   Tobacco Use   Smoking status: Former    Types: Cigarettes    Quit date: 2008    Years since quitting: 14.6  Smokeless tobacco: Never  Vaping Use   Vaping Use: Never used  Substance Use Topics   Alcohol use: Yes    Alcohol/week: 2.0 - 3.0 standard drinks    Types: 2 - 3 Cans of beer per week   Drug use: Never     Allergies   Penicillins   Review of Systems Review of Systems See HPI  Physical Exam Triage Vital Signs ED Triage Vitals  Enc Vitals Group     BP 03/15/21 1300 120/84     Pulse Rate 03/15/21 1300 84     Resp 03/15/21 1300 18     Temp --      Temp Source 03/15/21 1300 Oral     SpO2 03/15/21 1300 96 %     Weight 03/15/21 1258 220 lb (99.8 kg)     Height 03/15/21 1258 5\' 8"  (1.727 m)     Head Circumference --      Peak Flow --      Pain Score 03/15/21 1257 8     Pain Loc --      Pain Edu? --      Excl. in GC? --    No data found.  Updated Vital Signs BP 120/84 (BP Location: Right Arm)   Pulse 84   Resp 18   Ht 5\' 8"  (1.727 m)   Wt 99.8 kg   LMP  (LMP Unknown)   SpO2 96%   BMI 33.45 kg/m      Physical Exam Constitutional:      General: She is not in acute distress.    Appearance: She is  well-developed. She is obese.     Comments: Appears uncomfortable  HENT:     Head: Normocephalic and atraumatic.     Mouth/Throat:     Comments: Mask is in place Eyes:     Conjunctiva/sclera: Conjunctivae normal.     Pupils: Pupils are equal, round, and reactive to light.  Cardiovascular:     Rate and Rhythm: Normal rate.  Pulmonary:     Effort: Pulmonary effort is normal. No respiratory distress.  Abdominal:     General: There is no distension.     Palpations: Abdomen is soft.  Musculoskeletal:        General: Normal range of motion.     Cervical back: Normal range of motion.  Feet:     Comments: Right foot is examined.  There is soft tissue swelling below the lateral malleolus.  No tenderness in this area.  No tenderness over the malleoli.  Good range of motion.  There is tenderness in the anterior ankle joint that is acute.  Mild tenderness across the midfoot.  No pain over the metatarsals or toes.  No pain with movement of the toes minimal tenderness with squeeze pressure of the Achilles tendon near its insertion.  Pain with foot dorsiflexion Skin:    General: Skin is warm and dry.  Neurological:     Mental Status: She is alert.  Psychiatric:        Mood and Affect: Mood normal.        Behavior: Behavior normal.     UC Treatments / Results  Labs (all labs ordered are listed, but only abnormal results are displayed) Labs Reviewed - No data to display  EKG   Radiology DG Ankle Complete Right  Result Date: 03/15/2021 CLINICAL DATA:  Right foot and ankle pain with swelling and redness, NKI, pain to heel, stands often No previous surgeries or injections EXAM:  RIGHT ANKLE - COMPLETE 3+ VIEW; RIGHT FOOT COMPLETE - 3+ VIEW COMPARISON:  None. FINDINGS: Right foot: No acute fracture or dislocation. No evidence of arthropathy. Regional soft tissues are unremarkable. Right ankle: There is no evidence of fracture, dislocation, or joint effusion. There is no evidence of arthropathy or  other focal bone abnormality. Diffuse soft tissue swelling. IMPRESSION: No acute finding in the right foot or ankle. Electronically Signed   By: Emmaline Kluver M.D.   On: 03/15/2021 14:11   DG Foot Complete Right  Result Date: 03/15/2021 CLINICAL DATA:  Right foot and ankle pain with swelling and redness, NKI, pain to heel, stands often No previous surgeries or injections EXAM: RIGHT ANKLE - COMPLETE 3+ VIEW; RIGHT FOOT COMPLETE - 3+ VIEW COMPARISON:  None. FINDINGS: Right foot: No acute fracture or dislocation. No evidence of arthropathy. Regional soft tissues are unremarkable. Right ankle: There is no evidence of fracture, dislocation, or joint effusion. There is no evidence of arthropathy or other focal bone abnormality. Diffuse soft tissue swelling. IMPRESSION: No acute finding in the right foot or ankle. Electronically Signed   By: Emmaline Kluver M.D.   On: 03/15/2021 14:11    Procedures Procedures (including critical care time)  Medications Ordered in UC Medications - No data to display  Initial Impression / Assessment and Plan / UC Course  I have reviewed the triage vital signs and the nursing notes.  Pertinent labs & imaging results that were available during my care of the patient were reviewed by me and considered in my medical decision making (see chart for details).     To my review there is some early arthritis in the midfoot.  Seen on dorsal view of the foot x-rays.  I think she has overuse injury and foot pain from walking, pes planus, probably poor choice of shoe wear.  We will give her steroids keep her off her feet for 3 days.  She can see sports medicine follow-up, or choose to see.  An orthopedic foot and ankle provider Final Clinical Impressions(s) / UC Diagnoses   Final diagnoses:  Acute right ankle pain  Right foot pain     Discharge Instructions      Stay off of your feet for a few days.  Elevate foot to reduce swelling.  Take prednisone as directed. The  prednisone comes in a Dosepak, take all of day 1 today While you are on prednisone stop your meloxicam.  You may restart meloxicam after you are done with prednisone Use ice for 20 minutes every couple of hours Call your primary care doctor if not improving by next week.  You may need to see Dr. Benjamin Stain   ED Prescriptions     Medication Sig Dispense Auth. Provider   predniSONE (STERAPRED UNI-PAK 21 TAB) 10 MG (21) TBPK tablet Take by mouth daily. tad 21 tablet Eustace Moore, MD      PDMP not reviewed this encounter.   Eustace Moore, MD 03/15/21 1455

## 2021-03-15 NOTE — Discharge Instructions (Addendum)
Stay off of your feet for a few days.  Elevate foot to reduce swelling.  Take prednisone as directed. The prednisone comes in a Dosepak, take all of day 1 today While you are on prednisone stop your meloxicam.  You may restart meloxicam after you are done with prednisone Use ice for 20 minutes every couple of hours Call your primary care doctor if not improving by next week.  You may need to see Dr. Benjamin Stain

## 2021-03-15 NOTE — ED Triage Notes (Signed)
2 months ago starting having pain on top of foot  Now starting to have pain behind ankle  Around both sides of the achilles  Tried new shoes, topical meds, and Tylenol for pain 1000mg   Works 8 hours on feet

## 2021-04-08 ENCOUNTER — Other Ambulatory Visit: Payer: Self-pay | Admitting: Sports Medicine

## 2021-04-08 DIAGNOSIS — M25562 Pain in left knee: Secondary | ICD-10-CM

## 2021-04-08 DIAGNOSIS — G8929 Other chronic pain: Secondary | ICD-10-CM

## 2021-04-25 ENCOUNTER — Other Ambulatory Visit: Payer: Self-pay

## 2021-04-25 ENCOUNTER — Ambulatory Visit (INDEPENDENT_AMBULATORY_CARE_PROVIDER_SITE_OTHER): Payer: BLUE CROSS/BLUE SHIELD

## 2021-04-25 ENCOUNTER — Ambulatory Visit: Payer: BLUE CROSS/BLUE SHIELD | Admitting: Sports Medicine

## 2021-04-25 DIAGNOSIS — M5412 Radiculopathy, cervical region: Secondary | ICD-10-CM

## 2021-04-25 MED ORDER — PREDNISONE 50 MG PO TABS: ORAL_TABLET | ORAL | 0 refills | Status: DC

## 2021-04-25 NOTE — Progress Notes (Signed)
    Procedures performed today:    None.  Independent interpretation of notes and tests performed by another provider:   None.  Brief History, Exam, Impression, and Recommendations:    Radiculitis of left cervical region This is a pleasant 55 year old female, she has been having increasing pain in her neck with radiation down into the left periscapular region, left arm down to the second and third fingers. Consistent with left C7 cervical radiculitis, known lumbar DDD. We discussed the pathophysiology and the anatomy, treatment protocols, we will add 5 days of prednisone, x-rays, formal physical therapy, return to see me in 6 weeks, MRI for interventional planning if no better.    ___________________________________________ Ihor Austin. Benjamin Stain, M.D., ABFM., CAQSM. Primary Care and Sports Medicine [facility]  Adjunct Instructor of Family Medicine  [employer]

## 2021-04-25 NOTE — Assessment & Plan Note (Signed)
This is a pleasant 55 year old female, she has been having increasing pain in her neck with radiation down into the left periscapular region, left arm down to the second and third fingers. Consistent with left C7 cervical radiculitis, known lumbar DDD. We discussed the pathophysiology and the anatomy, treatment protocols, we will add 5 days of prednisone, x-rays, formal physical therapy, return to see me in 6 weeks, MRI for interventional planning if no better.

## 2021-04-28 ENCOUNTER — Telehealth: Payer: Self-pay

## 2021-04-28 ENCOUNTER — Ambulatory Visit: Payer: BLUE CROSS/BLUE SHIELD | Admitting: Physician Assistant

## 2021-04-28 DIAGNOSIS — M5412 Radiculopathy, cervical region: Secondary | ICD-10-CM

## 2021-04-28 MED ORDER — TRAMADOL HCL 50 MG PO TABS: 50.0000 mg | ORAL_TABLET | Freq: Three times a day (TID) | ORAL | 0 refills | Status: DC | PRN

## 2021-04-28 MED ORDER — GABAPENTIN 300 MG PO CAPS: ORAL_CAPSULE | ORAL | 3 refills | Status: DC

## 2021-04-28 NOTE — Telephone Encounter (Signed)
Adding a course of Neurontin, tramadol, note written.

## 2021-04-28 NOTE — Telephone Encounter (Signed)
Patient called to state that the Prednisone is not doing anything for the pain and discomfort and she wants something else. She also stated that she was not able to go to work today and would like a work note.

## 2021-04-28 NOTE — Telephone Encounter (Signed)
Patient is aware of the additional medications and that the note is available for pick up.

## 2021-05-20 ENCOUNTER — Telehealth (INDEPENDENT_AMBULATORY_CARE_PROVIDER_SITE_OTHER): Payer: BLUE CROSS/BLUE SHIELD | Admitting: Medical-Surgical

## 2021-05-20 ENCOUNTER — Encounter: Payer: Self-pay | Admitting: Medical-Surgical

## 2021-05-20 DIAGNOSIS — R6889 Other general symptoms and signs: Secondary | ICD-10-CM | POA: Diagnosis not present

## 2021-05-20 LAB — POCT INFLUENZA A/B
Influenza A, POC: NEGATIVE
Influenza B, POC: NEGATIVE

## 2021-05-20 NOTE — Progress Notes (Signed)
Virtual Visit via Telephone   I connected with  Katrina Cole  on 05/20/21 by telephone/telehealth and verified that I am speaking with the correct person using two identifiers.   I discussed the limitations, risks, security and privacy concerns of performing an evaluation and management service by telephone, including the higher likelihood of inaccurate diagnosis and treatment, and the availability of in person appointments.  We also discussed the likely need of an additional face to face encounter for complete and high quality delivery of care.  I also discussed with the patient that there may be a patient responsible charge related to this service. The patient expressed understanding and wishes to proceed.  Provider location is in medical facility. Patient location is at their home, different from provider location. People involved in care of the patient during this telehealth encounter were myself, my nurse/medical assistant, and my front office/scheduling team member.  CC: upper respiratory symptoms  HPI: Pleasant 55 year old female presenting via telephone with reports of 4 days of upper respiratory symptoms including eye pressure, nasal congestion, dental pain, headache, pressure, and generalized malaise.  She has been using Tylenol and over-the-counter your nasal spray with antihistamine.  These provided minimal relief.  Her symptoms started abruptly on Saturday and have progressively worsened.  She has not tested for COVID or flu.  No chest pain, shortness of breath, or GI symptoms involved.  Review of Systems: See HPI for pertinent positives and negatives.   Objective Findings:    General: Speaking full sentences, no audible heavy breathing.  Sounds alert and appropriately interactive.    Impression and Recommendations:    1. Flu-like symptoms Symptoms consistent with a viral upper respiratory infection.  Would like patient to report to our office for drive up flu and COVID swab for  further evaluation.  Discussed symptomatic treatment with over-the-counter medications.  Limit use of over-the-counter nasal sprays (specifically Afrin) to no more than 3 days to prevent rebound congestion.  Patient advised to come to our practice and park under the drive up area.  Phone number provided to call us to notify that she is here and we will go out and get her swabs completed.  I discussed the above assessment and treatment plan with the patient. The patient was provided an opportunity to ask questions and all were answered. The patient agreed with the plan and demonstrated an understanding of the instructions.   The patient was advised to call back or seek an in-person evaluation if the symptoms worsen or if the condition fails to improve as anticipated.  20 minutes of non-face-to-face time was provided during this encounter.  Return if symptoms worsen or fail to improve. ___________________________________________ Christen Butter, DNP, APRN, FNP-BC Primary Care and Sports Medicine [facility]

## 2021-05-20 NOTE — Addendum Note (Signed)
Addended by: Thermon Leyland on: 05/20/2021 10:12 AM   Modules accepted: Orders

## 2021-05-21 LAB — SARS-COV-2, NAA 2 DAY TAT

## 2021-05-21 LAB — NOVEL CORONAVIRUS, NAA: SARS-CoV-2, NAA: NOT DETECTED

## 2021-06-04 ENCOUNTER — Ambulatory Visit: Payer: BLUE CROSS/BLUE SHIELD | Attending: Internal Medicine

## 2021-06-04 DIAGNOSIS — Z23 Encounter for immunization: Secondary | ICD-10-CM

## 2021-06-04 NOTE — Progress Notes (Signed)
   Covid-19 Vaccination Clinic  Name:  Selita Staiger    MRN: [id number] DOB: 1966-04-16  06/04/2021  Ms. Dauenhauer was observed post Covid-19 immunization for 15 minutes without incident. She was provided with Vaccine Information Sheet and instruction to access the V-Safe system.   Ms. Omura was instructed to call 911 with any severe reactions post vaccine: Difficulty breathing  Swelling of face and throat  A fast heartbeat  A bad rash all over body  Dizziness and weakness   Immunizations Administered     Name Date Dose VIS Date Route   Pfizer Covid-19 Vaccine Bivalent Booster 06/04/2021 10:57 AM 0.3 mL 03/12/2021 Intramuscular   Manufacturer: [employer], [employer]   Lot: [id number]   NDC: [phone]

## 2021-06-10 ENCOUNTER — Ambulatory Visit: Payer: BLUE CROSS/BLUE SHIELD | Admitting: Sports Medicine

## 2021-06-10 ENCOUNTER — Other Ambulatory Visit: Payer: Self-pay

## 2021-06-10 ENCOUNTER — Ambulatory Visit (INDEPENDENT_AMBULATORY_CARE_PROVIDER_SITE_OTHER): Payer: BLUE CROSS/BLUE SHIELD

## 2021-06-10 DIAGNOSIS — S99921D Unspecified injury of right foot, subsequent encounter: Secondary | ICD-10-CM

## 2021-06-10 DIAGNOSIS — M5412 Radiculopathy, cervical region: Secondary | ICD-10-CM | POA: Diagnosis not present

## 2021-06-10 DIAGNOSIS — S99921A Unspecified injury of right foot, initial encounter: Secondary | ICD-10-CM

## 2021-06-10 DIAGNOSIS — E669 Obesity, unspecified: Secondary | ICD-10-CM | POA: Diagnosis not present

## 2021-06-10 NOTE — Assessment & Plan Note (Addendum)
Katrina Cole also has some chronic right foot pain, localized over the fifth metatarsal, I would like some updated x-rays of her foot as I do think she may have a fifth MT stress injury, I would also like her to get some custom molded orthotics. She did have some ankle pain at the sinus tarsi which has resolved with ASO. She does have a lot of weight to lose, so she will work with her PCP on this.

## 2021-06-10 NOTE — Progress Notes (Signed)
    Procedures performed today:    None.  Independent interpretation of notes and tests performed by another provider:   None.  Brief History, Exam, Impression, and Recommendations:    Radiculitis of left cervical region This is a pleasant 55 year old female, she was having some neck pain with radiation down the left periscapular region, left arm to the second and third fingers, x-rays did show mild degenerative changes, retreated her with 5 days of prednisone, I have suggested therapy but I do not think she did it. She has improved considerably with the prednisone, but understandably this will return if she does not do the conditioning, adding some home cervical spine conditioning and she will think about seeing a chiropractor, return as needed for this.  Chronic right foot and ankle pain Tora also has some chronic right foot pain, localized over the fifth metatarsal, I would like some updated x-rays of her foot as I do think she may have a fifth MT stress injury, I would also like her to get some custom molded orthotics. She did have some ankle pain at the sinus tarsi which has resolved with ASO. She does have a lot of weight to lose, so she will work with her PCP on this.  Obesity Persistent lower extremity discomfort, she does need to work on aggressive weight loss, BMI well over 30. She will discuss this with her PCP.    ___________________________________________ Ihor Austin. Benjamin Stain, M.D., ABFM., CAQSM. Primary Care and Sports Medicine [facility]  Adjunct Instructor of Family Medicine  [employer]

## 2021-06-10 NOTE — Assessment & Plan Note (Signed)
Persistent lower extremity discomfort, she does need to work on aggressive weight loss, BMI well over 30. She will discuss this with her PCP.

## 2021-06-10 NOTE — Assessment & Plan Note (Signed)
This is a pleasant 55 year old female, she was having some neck pain with radiation down the left periscapular region, left arm to the second and third fingers, x-rays did show mild degenerative changes, retreated her with 5 days of prednisone, I have suggested therapy but I do not think she did it. She has improved considerably with the prednisone, but understandably this will return if she does not do the conditioning, adding some home cervical spine conditioning and she will think about seeing a chiropractor, return as needed for this.

## 2021-06-14 ENCOUNTER — Other Ambulatory Visit (HOSPITAL_BASED_OUTPATIENT_CLINIC_OR_DEPARTMENT_OTHER): Payer: Self-pay

## 2021-06-14 MED ORDER — PFIZER COVID-19 VAC BIVALENT 30 MCG/0.3ML IM SUSP
INTRAMUSCULAR | 0 refills | Status: DC
Start: 2021-06-04 — End: 2021-07-02
  Filled 2021-06-14: qty 0.3, 1d supply, fill #0

## 2021-06-16 ENCOUNTER — Other Ambulatory Visit (HOSPITAL_BASED_OUTPATIENT_CLINIC_OR_DEPARTMENT_OTHER): Payer: Self-pay

## 2021-07-02 ENCOUNTER — Other Ambulatory Visit: Payer: Self-pay

## 2021-07-02 ENCOUNTER — Encounter: Payer: Self-pay | Admitting: Family Medicine

## 2021-07-02 ENCOUNTER — Ambulatory Visit: Payer: BLUE CROSS/BLUE SHIELD | Admitting: Family Medicine

## 2021-07-02 VITALS — BP 150/100 | HR 104 | Temp 97.2°F | Ht 68.0 in | Wt 231.5 lb

## 2021-07-02 DIAGNOSIS — J111 Influenza due to unidentified influenza virus with other respiratory manifestations: Secondary | ICD-10-CM | POA: Diagnosis not present

## 2021-07-02 DIAGNOSIS — R0989 Other specified symptoms and signs involving the circulatory and respiratory systems: Secondary | ICD-10-CM | POA: Diagnosis not present

## 2021-07-02 LAB — POCT INFLUENZA A/B
Influenza A, POC: POSITIVE — AB
Influenza B, POC: NEGATIVE

## 2021-07-02 MED ORDER — PREDNISONE 50 MG PO TABS: ORAL_TABLET | ORAL | 0 refills | Status: DC

## 2021-07-02 MED ORDER — DOXYCYCLINE HYCLATE 100 MG PO TABS: 100.0000 mg | ORAL_TABLET | Freq: Two times a day (BID) | ORAL | 0 refills | Status: AC

## 2021-07-02 NOTE — Patient Instructions (Signed)
Start doxycycline and prednisone Push fluids  Call if symptoms worsen.

## 2021-07-02 NOTE — Progress Notes (Signed)
Katrina Cole - 55 y.o. female MRN [id number]  Date of birth: [birth year]  Subjective Chief Complaint  Patient presents with   URI    HPI Katrina Cole is a 55 year old female here today with complaint of flulike symptoms.  She reports symptoms started a little over a week ago.  Initially had fatigue, body aches, congestion and cough.  Symptoms worsened over the past week.  She has felt a little more dyspneic with productive cough and sinus pain.  Mucus is, brown/yellow in coloration.  She has had some chills but denies fever.  ROS:  A comprehensive ROS was completed and negative except as noted per HPI  Allergies  Allergen Reactions   Penicillins Anaphylaxis    Past Medical History:  Diagnosis Date   DDD (degenerative disc disease), lumbar    GERD (gastroesophageal reflux disease)     Past Surgical History:  Procedure Laterality Date   ARTHROSCOPIC REPAIR ACL     CARPAL TUNNEL RELEASE Bilateral 2019,2020   CESAREAN SECTION     DILATION AND CURETTAGE OF UTERUS     TRACHEOSTOMY     uterine ablation      Social History   Socioeconomic History   Marital status: Legally Separated    Spouse name: Not on file   Number of children: Not on file   Years of education: Not on file   Highest education level: Not on file  Occupational History   Not on file  Tobacco Use   Smoking status: Former    Types: Cigarettes    Quit date: 2008    Years since quitting: 14.9   Smokeless tobacco: Never  Vaping Use   Vaping Use: Never used  Substance and Sexual Activity   Alcohol use: Yes    Alcohol/week: 2.0 - 3.0 standard drinks    Types: 2 - 3 Cans of beer per week   Drug use: Never   Sexual activity: Not Currently    Birth control/protection: None  Other Topics Concern   Not on file  Social History Narrative   Not on file   Social Determinants of Health   Financial Resource Strain: Not on file  Food Insecurity: Not on file  Transportation Needs: Not on file  Physical Activity: Not  on file  Stress: Not on file  Social Connections: Not on file    Family History  Problem Relation Age of Onset   COPD Mother    Hypertension Father     Health Maintenance  Topic Date Due   Zoster Vaccines- Shingrix (1 of 2) 08/20/2021 (Originally 02/21/2017)   INFLUENZA VACCINE  10/10/2021 (Originally 02/10/2021)   Hepatitis C Screening  07/03/2025 (Originally 02/21/1985)   MAMMOGRAM  01/30/2022   PAP SMEAR-Modifier  01/22/2023   COLONOSCOPY (Pts 45-67yrs Insurance coverage will need to be confirmed)  07/03/2025   TETANUS/TDAP  12/11/2025   COVID-19 Vaccine  Completed   Pneumococcal Vaccine 64-10 Years old  Aged Out   HPV VACCINES  Aged Out   HIV Screening  Discontinued     ----------------------------------------------------------------------------------------------------------------------------------------------------------------------------------------------------------------- Physical Exam BP (!) 150/100 (BP Location: Left Arm, Patient Position: Sitting, Cuff Size: Large)    Pulse (!) 104    Temp (!) 97.2 F (36.2 C)    Ht 5\' 8"  (1.727 m)    Wt 231 lb 8 oz (105 kg)    LMP  (LMP Unknown)    SpO2 96%    BMI 35.20 kg/m   Physical Exam Constitutional:      Appearance: Normal appearance.  HENT:     Nose:     Comments: Bilateral maxillary sinus tenderness. Eyes:     General: No scleral icterus. Cardiovascular:     Rate and Rhythm: Normal rate and regular rhythm.  Pulmonary:     Effort: Pulmonary effort is normal.     Breath sounds: Normal breath sounds.  Musculoskeletal:     Cervical back: Neck supple.  Neurological:     General: No focal deficit present.     Mental Status: She is alert.  Psychiatric:        Mood and Affect: Mood normal.        Behavior: Behavior normal.     ------------------------------------------------------------------------------------------------------------------------------------------------------------------------------------------------------------------- Assessment and Plan  Influenza Positive for influenza A.  Symptoms are worsening I suspect she may have secondary bacterial sinusitis.  Starting doxycycline along with prednisone.  Recommend continued supportive care as well with increased fluids, humidified air and rest.   Meds ordered this encounter  Medications   doxycycline (VIBRA-TABS) 100 MG tablet    Sig: Take 1 tablet (100 mg total) by mouth 2 (two) times daily for 10 days.    Dispense:  20 tablet    Refill:  0   predniSONE (DELTASONE) 50 MG tablet    Sig: Take 1 tab PO daily x5 days    Dispense:  5 tablet    Refill:  0    No follow-ups on file.    This visit occurred during the SARS-CoV-2 public health emergency.  Safety protocols were in place, including screening questions prior to the visit, additional usage of staff PPE, and extensive cleaning of exam room while observing appropriate contact time as indicated for disinfecting solutions.

## 2021-07-02 NOTE — Assessment & Plan Note (Signed)
Positive for influenza A.  Symptoms are worsening I suspect she may have secondary bacterial sinusitis.  Starting doxycycline along with prednisone.  Recommend continued supportive care as well with increased fluids, humidified air and rest.

## 2021-07-09 ENCOUNTER — Ambulatory Visit: Payer: BLUE CROSS/BLUE SHIELD | Admitting: Sports Medicine

## 2022-02-25 NOTE — Progress Notes (Signed)
Erroneous encounter. Please disregard.

## 2022-02-26 ENCOUNTER — Encounter: Payer: BLUE CROSS/BLUE SHIELD | Admitting: Medical-Surgical

## 2022-04-10 ENCOUNTER — Ambulatory Visit
Admission: EM | Admit: 2022-04-10 | Discharge: 2022-04-10 | Disposition: A | Discharge status: Home / Self Care | Payer: BLUE CROSS/BLUE SHIELD | Attending: Physician Assistant | Admitting: Physician Assistant

## 2022-04-10 ENCOUNTER — Encounter: Payer: Self-pay | Admitting: Emergency Medicine

## 2022-04-10 ENCOUNTER — Other Ambulatory Visit: Payer: Self-pay

## 2022-04-10 ENCOUNTER — Telehealth: Payer: Self-pay | Admitting: Emergency Medicine

## 2022-04-10 ENCOUNTER — Ambulatory Visit (INDEPENDENT_AMBULATORY_CARE_PROVIDER_SITE_OTHER): Payer: BLUE CROSS/BLUE SHIELD

## 2022-04-10 DIAGNOSIS — S2231XA Fracture of one rib, right side, initial encounter for closed fracture: Secondary | ICD-10-CM | POA: Diagnosis not present

## 2022-04-10 DIAGNOSIS — R0781 Pleurodynia: Secondary | ICD-10-CM

## 2022-04-10 DIAGNOSIS — W19XXXA Unspecified fall, initial encounter: Secondary | ICD-10-CM | POA: Diagnosis not present

## 2022-04-10 MED ORDER — OXYCODONE-ACETAMINOPHEN 5-325 MG PO TABS: 1.0000 | ORAL_TABLET | ORAL | 0 refills | Status: DC | PRN

## 2022-04-10 MED ORDER — HYDROCODONE-ACETAMINOPHEN 5-325 MG PO TABS: 1.0000 | ORAL_TABLET | ORAL | 0 refills | Status: DC | PRN

## 2022-04-10 NOTE — Telephone Encounter (Signed)
Patient called pharmacy is out of Hydrocodone 5-325. I called pharmacy to ask what they do have, they have Oxy 5-325, advised Alyse Low, she will send RX for that, currently with a patient.Called patient back to let her know.

## 2022-04-10 NOTE — Discharge Instructions (Signed)
Follow up with Dr. Darene Lamer for evaluation

## 2022-04-10 NOTE — ED Provider Notes (Signed)
[facility]    CSN: [id number] Arrival date & time: 04/10/22  0801      History   Chief Complaint Chief Complaint  Patient presents with  . Fall    HPI Katrina Cole is a 55 y.o. female.   Patient reports she fell forward 2 days ago and struck her chest patient complains of pain in her right upper back.  Patient complains of discomfort when she takes a deep breath patient is concerned that she may have cracked or dislocated a rib.  She denies any impact to her head she did not have any loss of consciousness patient denies any neck pain she denies any difficulty breathing although breathing is painful.  Patient denies any other areas of injury.  Patient complains of a swollen area to right upper back  The history is provided by the patient. No language interpreter was used.  Fall  Past Medical History:  Diagnosis Date  . DDD (degenerative disc disease), lumbar   . GERD (gastroesophageal reflux disease)     Patient Active Problem List   Diagnosis Date Noted  . Influenza 07/02/2021  . Obesity 06/10/2021  . Radiculitis of left cervical region 04/25/2021  . Chronic pain of left knee 12/16/2020  . Anxiety 07/31/2020  . Insomnia 07/31/2020  . Tibialis posterior tendinitis, left 05/03/2020  . Posterior vaginal wall prolapse 01/22/2020  . Lumbar degenerative disc disease 01/17/2020  . Chronic right foot and ankle pain 12/27/2019  . Fracture of distal phalanx of right middle finger 11/01/2019    Past Surgical History:  Procedure Laterality Date  . ARTHROSCOPIC REPAIR ACL    . CARPAL TUNNEL RELEASE Bilateral 2019,2020  . CESAREAN SECTION    . DILATION AND CURETTAGE OF UTERUS    . TRACHEOSTOMY    . uterine ablation      OB History     Gravida  5   Para  4   Term      Preterm      AB      Living  4      SAB      IAB      Ectopic      Multiple      Live Births  4            Home Medications    Prior to Admission medications    Medication Sig Start Date End Date Taking? Authorizing Provider  omeprazole (PRILOSEC) 40 MG capsule Take 1 capsule (40 mg total) by mouth daily. **NO MORE REFILLS UNTIL PATIENT COMES IN FOR AN OFFICE VISIT** 12/31/20   Christen Butter, NP    Family History Family History  Problem Relation Age of Onset  . COPD Mother   . Hypertension Father     Social History Social History   Tobacco Use  . Smoking status: Former    Types: Cigarettes    Quit date: 2008    Years since quitting: 15.7  . Smokeless tobacco: Never  Vaping Use  . Vaping Use: Never used  Substance Use Topics  . Alcohol use: Yes    Alcohol/week: 2.0 - 3.0 standard drinks of alcohol    Types: 2 - 3 Cans of beer per week  . Drug use: Never     Allergies   Penicillins   Review of Systems Review of Systems  All other systems reviewed and are negative.    Physical Exam Triage Vital Signs ED Triage Vitals  Enc Vitals Group  BP 04/10/22 0822 (!) 142/96     Pulse Rate 04/10/22 0822 80     Resp 04/10/22 0822 16     Temp 04/10/22 0822 98.9 F (37.2 C)     Temp Source 04/10/22 0822 Oral     SpO2 04/10/22 0822 98 %     Weight 04/10/22 0824 220 lb (99.8 kg)     Height 04/10/22 0824 5\' 8"  (1.727 m)     Head Circumference --      Peak Flow --      Pain Score 04/10/22 0823 9     Pain Loc --      Pain Edu? --      Excl. in [place]? --    No data found.  Updated Vital Signs BP (!) 142/96 (BP Location: Left Arm)   Pulse 80   Temp 98.9 F (37.2 C) (Oral)   Resp 16   Ht 5\' 8"  (1.727 m)   Wt 99.8 kg   LMP  (LMP Unknown)   SpO2 98%   BMI 33.45 kg/m   Visual Acuity Right Eye Distance:   Left Eye Distance:   Bilateral Distance:    Right Eye Near:   Left Eye Near:    Bilateral Near:     Physical Exam Vitals and nursing note reviewed.  Constitutional:      Appearance: She is well-developed.  HENT:     Head: Normocephalic.  Pulmonary:     Effort: Pulmonary effort is normal.  Abdominal:     General:  There is no distension.  Musculoskeletal:        General: Normal range of motion.     Cervical back: Normal range of motion.     Comments: Tender right upper back under her right scapula slight erythema  Skin:    General: Skin is warm.  Neurological:     Mental Status: She is alert and oriented to person, place, and time.     UC Treatments / Results  Labs (all labs ordered are listed, but only abnormal results are displayed) Labs Reviewed - No data to display  EKG   Radiology DG Ribs Unilateral W/Chest Right  Result Date: 04/10/2022 CLINICAL DATA:  Fall 3 days ago with right rib pain. EXAM: RIGHT RIBS AND CHEST - 3+ VIEW COMPARISON:  None Available. FINDINGS: Frontal chest radiograph demonstrates normal heart size. There is no evidence of pulmonary edema, consolidation, pneumothorax or pleural fluid. Right-sided rib films demonstrate possible subtle nondisplaced fracture involving the distal aspect of the right sixth rib. No bony lesions identified. IMPRESSION: Possible subtle nondisplaced fracture of the distal right sixth rib. No associated pneumothorax or pleural fluid. Electronically Signed   By: Aletta Edouard M.D.   On: 04/10/2022 09:18    Procedures Procedures (including critical care time)  Medications Ordered in UC Medications - No data to display  Initial Impression / Assessment and Plan / UC Course  I have reviewed the triage vital signs and the nursing notes.  Pertinent labs & imaging results that were available during my care of the patient were reviewed by me and considered in my medical decision making (see chart for details).     MDM chest x-ray shows a possible subtle nondisplaced right sixth rib fracture.  Patient is counseled on results she is given a prescription for pain medicine she is advised to follow-up with her physician for recheck she is advised to take deep breaths to return if any fever chills or difficulty breathing Final Clinical  Impressions(s)  / UC Diagnoses   Final diagnoses:  Closed fracture of one rib of right side, initial encounter  Fall, initial encounter   Discharge Instructions   None    ED Prescriptions   None    PDMP not reviewed this encounter. An After Visit Summary was printed and given to the patient.    Elson Areas, [place] 04/10/22 1607

## 2022-04-10 NOTE — ED Triage Notes (Addendum)
Patient fell x 2 days ago getting up off the floor, pain in neck, taking a deep, right arm, upper right back pain. Has been icing back but no meds.

## 2022-04-11 ENCOUNTER — Telehealth: Payer: Self-pay | Admitting: Emergency Medicine

## 2022-04-11 NOTE — Telephone Encounter (Signed)
LMTRC.  Advised if doing well to disregard the call.  Any questions or concerns, feel free to contact the office. 

## 2022-04-13 ENCOUNTER — Ambulatory Visit (INDEPENDENT_AMBULATORY_CARE_PROVIDER_SITE_OTHER): Payer: BLUE CROSS/BLUE SHIELD

## 2022-04-13 ENCOUNTER — Encounter: Payer: Self-pay | Admitting: Sports Medicine

## 2022-04-13 ENCOUNTER — Ambulatory Visit: Payer: BLUE CROSS/BLUE SHIELD | Admitting: Sports Medicine

## 2022-04-13 DIAGNOSIS — M5136 Other intervertebral disc degeneration, lumbar region: Secondary | ICD-10-CM

## 2022-04-13 DIAGNOSIS — M51369 Other intervertebral disc degeneration, lumbar region without mention of lumbar back pain or lower extremity pain: Secondary | ICD-10-CM

## 2022-04-13 DIAGNOSIS — S2231XA Fracture of one rib, right side, initial encounter for closed fracture: Secondary | ICD-10-CM

## 2022-04-13 MED ORDER — OXYCODONE-ACETAMINOPHEN 5-325 MG PO TABS: 0.5000 | ORAL_TABLET | ORAL | 0 refills | Status: DC | PRN

## 2022-04-13 NOTE — Assessment & Plan Note (Signed)
Recent fall, seen in urgent care, x-ray showed potential right sixth rib nondisplaced fracture. On exam she has tenderness posteriorly around the sixth rib, lungs are clear. She has too much discomfort to put a rib belt, we will add some oxycodone and keep her out of work for now.

## 2022-04-13 NOTE — Progress Notes (Signed)
    Procedures performed today:    None.  Independent interpretation of notes and tests performed by another provider:   None.  Brief History, Exam, Impression, and Recommendations:    Fracture of one rib, right side, initial encounter for closed fracture Recent fall, seen in urgent care, x-ray showed potential right sixth rib nondisplaced fracture. On exam she has tenderness posteriorly around the sixth rib, lungs are clear. She has too much discomfort to put a rib belt, we will add some oxycodone and keep her out of work for now.   Lumbar degenerative disc disease Kiriana also has worsening left-sided low back pain after the fall, she localizes the pain at the sacroiliac joint. She does have a history of chronic lumbar disc disease, discogenic axial pain with radiculitis and she did well with steroids back in 2021. I would like her to get some updated x-rays left SI joint as well as SI joint conditioning and return to see me in about 4 to 6 weeks for this.  Chronic process with exacerbation and pharmacologic invention.  ____________________________________________ Gwen Her. Dianah Field, M.D., ABFM., CAQSM., AME. Primary Care and Sports Medicine [place] [facility]  Adjunct Professor of [place] [facility] [employer] Medicine  Risk manager

## 2022-04-13 NOTE — Assessment & Plan Note (Signed)
Katrina Cole also has worsening left-sided low back pain after the fall, she localizes the pain at the sacroiliac joint. She does have a history of chronic lumbar disc disease, discogenic axial pain with radiculitis and she did well with steroids back in 2021. I would like her to get some updated x-rays left SI joint as well as SI joint conditioning and return to see me in about 4 to 6 weeks for this.

## 2022-05-04 ENCOUNTER — Ambulatory Visit: Payer: BLUE CROSS/BLUE SHIELD | Admitting: Sports Medicine

## 2022-05-04 DIAGNOSIS — M51369 Other intervertebral disc degeneration, lumbar region without mention of lumbar back pain or lower extremity pain: Secondary | ICD-10-CM

## 2022-05-04 DIAGNOSIS — M5136 Other intervertebral disc degeneration, lumbar region: Secondary | ICD-10-CM | POA: Diagnosis not present

## 2022-05-04 DIAGNOSIS — S2231XA Fracture of one rib, right side, initial encounter for closed fracture: Secondary | ICD-10-CM

## 2022-05-04 NOTE — Assessment & Plan Note (Signed)
Katrina Cole returns, she is a very pleasant 56 year old female, she is now approximately 3 to 4 weeks status post fall causing right sixth nondisplaced rib fracture, doing much better today.

## 2022-05-04 NOTE — Progress Notes (Signed)
    Procedures performed today:    None.  Independent interpretation of notes and tests performed by another provider:   None.  Brief History, Exam, Impression, and Recommendations:    Fracture of one rib, right side, initial encounter for closed fracture Katrina Cole returns, she is a very pleasant 56 year old female, she is now approximately 3 to 4 weeks status post fall causing right sixth nondisplaced rib fracture, doing much better today.   Lumbar degenerative disc disease Katrina Cole also had worsening left-sided low back pain after the fall localized at the sacroiliac joint. I did personally review her x-rays today that showed multilevel lumbar spondylosis with significant disc space narrowing as well as bilateral SI joint osteoarthritis. She has been doing SI joint conditioning and her pain is down to a 1 out of 10, I would like her to add some lumbar spine core conditioning as well, and she can return to see me as needed, if pain returns I would likely try a left sacroiliac joint injection with ultrasound guidance before proceeding down the pathway of a lumbar pain generator.    ____________________________________________ Gwen Her. Dianah Field, M.D., ABFM., CAQSM., AME. Primary Care and Sports Medicine [facility]  Adjunct Professor of [place] [employer] [facility] of Medicine  Risk manager

## 2022-05-04 NOTE — Assessment & Plan Note (Signed)
Rhen also had worsening left-sided low back pain after the fall localized at the sacroiliac joint. I did personally review her x-rays today that showed multilevel lumbar spondylosis with significant disc space narrowing as well as bilateral SI joint osteoarthritis. She has been doing SI joint conditioning and her pain is down to a 1 out of 10, I would like her to add some lumbar spine core conditioning as well, and she can return to see me as needed, if pain returns I would likely try a left sacroiliac joint injection with ultrasound guidance before proceeding down the pathway of a lumbar pain generator.

## 2022-05-11 ENCOUNTER — Encounter: Payer: BLUE CROSS/BLUE SHIELD | Admitting: Physician Assistant

## 2022-05-29 ENCOUNTER — Ambulatory Visit
Admission: EM | Admit: 2022-05-29 | Discharge: 2022-05-29 | Disposition: A | Discharge status: Home / Self Care | Payer: BLUE CROSS/BLUE SHIELD | Attending: Family Medicine | Admitting: Family Medicine

## 2022-05-29 ENCOUNTER — Encounter: Payer: Self-pay | Admitting: Emergency Medicine

## 2022-05-29 DIAGNOSIS — T148XXA Other injury of unspecified body region, initial encounter: Secondary | ICD-10-CM | POA: Diagnosis not present

## 2022-05-29 DIAGNOSIS — W540XXA Bitten by dog, initial encounter: Secondary | ICD-10-CM | POA: Diagnosis not present

## 2022-05-29 MED ORDER — DOXYCYCLINE HYCLATE 100 MG PO CAPS: 100.0000 mg | ORAL_CAPSULE | Freq: Two times a day (BID) | ORAL | 0 refills | Status: AC

## 2022-05-29 NOTE — ED Provider Notes (Signed)
[facility]    CSN: [id number] Arrival date & time: 05/29/22  0817      History   Chief Complaint Chief Complaint  Patient presents with   Finger Injury    HPI Katrina Cole is a 56 y.o. female.   HPI 56 year old female presents with right index finger injury reports while playing with her dog last night at roughly 6:30 PM reports that she put her right index finger in her dog's mouth to retrieve a toy when her dog inadvertently bit her finger. Reports her dog shots including rabies vaccines are all current.  Reports her last Tdap was in 2016.  PMH significant for obesity, insomnia, and radiculitis of left cervical region.  Past Medical History:  Diagnosis Date   DDD (degenerative disc disease), lumbar    GERD (gastroesophageal reflux disease)     Patient Active Problem List   Diagnosis Date Noted   Fracture of one rib, right side, initial encounter for closed fracture 04/13/2022   Influenza 07/02/2021   Obesity 06/10/2021   Radiculitis of left cervical region 04/25/2021   Chronic pain of left knee 12/16/2020   Anxiety 07/31/2020   Insomnia 07/31/2020   Tibialis posterior tendinitis, left 05/03/2020   Posterior vaginal wall prolapse 01/22/2020   Lumbar degenerative disc disease 01/17/2020   Chronic right foot and ankle pain 12/27/2019   Fracture of distal phalanx of right middle finger 11/01/2019    Past Surgical History:  Procedure Laterality Date   ARTHROSCOPIC REPAIR ACL     CARPAL TUNNEL RELEASE Bilateral 2019,2020   CESAREAN SECTION     DILATION AND CURETTAGE OF UTERUS     TRACHEOSTOMY     uterine ablation      OB History     Gravida  5   Para  4   Term      Preterm      AB      Living  4      SAB      IAB      Ectopic      Multiple      Live Births  4            Home Medications    Prior to Admission medications   Medication Sig Start Date End Date Taking? Authorizing Provider  doxycycline (VIBRAMYCIN) 100 MG  capsule Take 1 capsule (100 mg total) by mouth 2 (two) times daily for 7 days. 05/29/22 06/05/22 Yes Eliezer Lofts, FNP  omeprazole (PRILOSEC) 40 MG capsule Take 1 capsule (40 mg total) by mouth daily. **NO MORE REFILLS UNTIL PATIENT COMES IN FOR AN OFFICE VISIT** 12/31/20  Yes Samuel Bouche, NP  oxyCODONE-acetaminophen (PERCOCET) 5-325 MG tablet Take 0.5-1 tablets by mouth every 4 (four) hours as needed for severe pain. 04/13/22 04/13/23  Silverio Decamp, MD    Family History Family History  Problem Relation Age of Onset   COPD Mother    Hypertension Father     Social History Social History   Tobacco Use   Smoking status: Former    Types: Cigarettes    Quit date: 2008    Years since quitting: 15.8   Smokeless tobacco: Never  Vaping Use   Vaping Use: Never used  Substance Use Topics   Alcohol use: Yes    Alcohol/week: 2.0 - 3.0 standard drinks of alcohol    Types: 2 - 3 Cans of beer per week   Drug use: Never     Allergies   Penicillins  Review of Systems Review of Systems  Skin:  Positive for wound.  All other systems reviewed and are negative.    Physical Exam Triage Vital Signs ED Triage Vitals  Enc Vitals Group     BP 05/29/22 0833 (!) 157/97     Pulse Rate 05/29/22 0833 83     Resp 05/29/22 0833 18     Temp 05/29/22 0833 98.9 F (37.2 C)     Temp Source 05/29/22 0833 Oral     SpO2 05/29/22 0833 97 %     Weight 05/29/22 0834 220 lb (99.8 kg)     Height 05/29/22 0834 5\' 8"  (1.727 m)     Head Circumference --      Peak Flow --      Pain Score --      Pain Loc --      Pain Edu? --      Excl. in [place]? --    No data found.  Updated Vital Signs BP (!) 157/97 (BP Location: Left Arm)   Pulse 83   Temp 98.9 F (37.2 C) (Oral)   Resp 18   Ht 5\' 8"  (1.727 m)   Wt 220 lb (99.8 kg)   LMP  (LMP Unknown)   SpO2 97%   BMI 33.45 kg/m       Physical Exam Vitals and nursing note reviewed.  Constitutional:      Appearance: Normal appearance. She is  obese.  HENT:     Head: Normocephalic and atraumatic.     Mouth/Throat:     Mouth: Mucous membranes are moist.     Pharynx: Oropharynx is clear.  Eyes:     Extraocular Movements: Extraocular movements intact.     Conjunctiva/sclera: Conjunctivae normal.     Pupils: Pupils are equal, round, and reactive to light.  Cardiovascular:     Rate and Rhythm: Normal rate and regular rhythm.     Pulses: Normal pulses.     Heart sounds: Normal heart sounds.  Pulmonary:     Effort: Pulmonary effort is normal.     Breath sounds: Normal breath sounds. No wheezing, rhonchi or rales.  Musculoskeletal:        General: Normal range of motion.     Cervical back: Normal range of motion and neck supple.  Skin:    General: Skin is warm and dry.     Comments: Right index finger (dorsum DIP): Small erythematous skin avulsion-please see image below  Neurological:     General: No focal deficit present.     Mental Status: She is alert and oriented to person, place, and time.      UC Treatments / Results  Labs (all labs ordered are listed, but only abnormal results are displayed) Labs Reviewed - No data to display  EKG   Radiology No results found.  Procedures Procedures (including critical care time)  Medications Ordered in UC Medications - No data to display  Initial Impression / Assessment and Plan / UC Course  I have reviewed the triage vital signs and the nursing notes.  Pertinent labs & imaging results that were available during my care of the patient were reviewed by me and considered in my medical decision making (see chart for details).     MDM: 1.  Dog bite initial encounter-Rx Doxycycline. Advised patient to take medication as directed with food to completion.  Advised patient may take OTC Advil for pain.  Encouraged patient to increase daily water intake to 64 ounces per  day while taking this medication.  Advised patient to leave affected area of right index finger open to air is  much as possible to allow area to heal by secondary intention/scab formation.  Encouraged patient to avoid putting any topical emollients, creams or ointment on this area for the next 3 weeks.  Advised patient if symptoms worsen and/or unresolved please follow-up with PCP or here for further evaluation.  Final Clinical Impressions(s) / UC Diagnoses   Final diagnoses:  Dog bite, initial encounter  Avulsion of skin     Discharge Instructions      Advised patient to take medication as directed with food to completion.  Advised patient may take OTC Advil for pain.  Encouraged patient to increase daily water intake to 64 ounces per day while taking this medication.  Advised patient to leave affected area of right index finger open to air is much as possible to allow area to heal by secondary intention/scab formation.  Encouraged patient to avoid putting any topical emollients, creams or ointment on this area for the next 3 weeks.  Advised patient if symptoms worsen and/or unresolved please follow-up with PCP or here for further evaluation.     ED Prescriptions     Medication Sig Dispense Auth. Provider   doxycycline (VIBRAMYCIN) 100 MG capsule Take 1 capsule (100 mg total) by mouth 2 (two) times daily for 7 days. 14 capsule Eliezer Lofts, FNP      PDMP not reviewed this encounter.   Eliezer Lofts, Katrina Cole 05/29/22 [id number]

## 2022-05-29 NOTE — ED Triage Notes (Signed)
Patient states that her dog bit her right index finger last night while playing.  Dog is current on vaccines.  Patient's last Tdap in 2016.

## 2022-05-29 NOTE — Discharge Instructions (Addendum)
Advised patient to take medication as directed with food to completion.  Advised patient may take OTC Advil for pain.  Encouraged patient to increase daily water intake to 64 ounces per day while taking this medication.  Advised patient to leave affected area of right index finger open to air is much as possible to allow area to heal by secondary intention/scab formation.  Encouraged patient to avoid putting any topical emollients, creams or ointment on this area for the next 3 weeks.  Advised patient if symptoms worsen and/or unresolved please follow-up with PCP or here for further evaluation.

## 2022-05-30 ENCOUNTER — Telehealth: Payer: Self-pay

## 2022-05-30 NOTE — Telephone Encounter (Signed)
LMTRC if any questions or concerns. 

## 2022-06-01 IMAGING — MG DIGITAL SCREENING BILAT W/ TOMO W/ CAD
6 of 10 series · 6 of 30 positions shown · non-contrast
Comparison: None

CLINICAL DATA: Screening. Baseline examination.

EXAM:
DIGITAL SCREENING BILATERAL MAMMOGRAM WITH TOMO AND CAD

[L CC synth-2D]
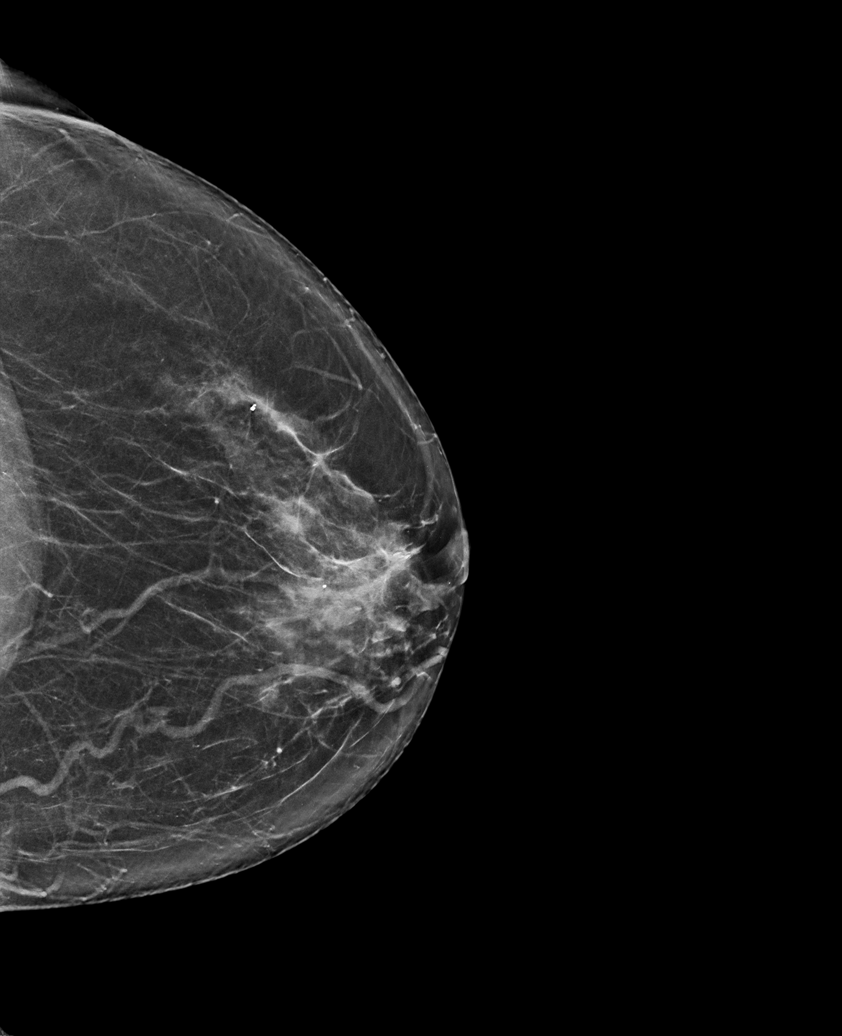

[L MLO synth-2D]
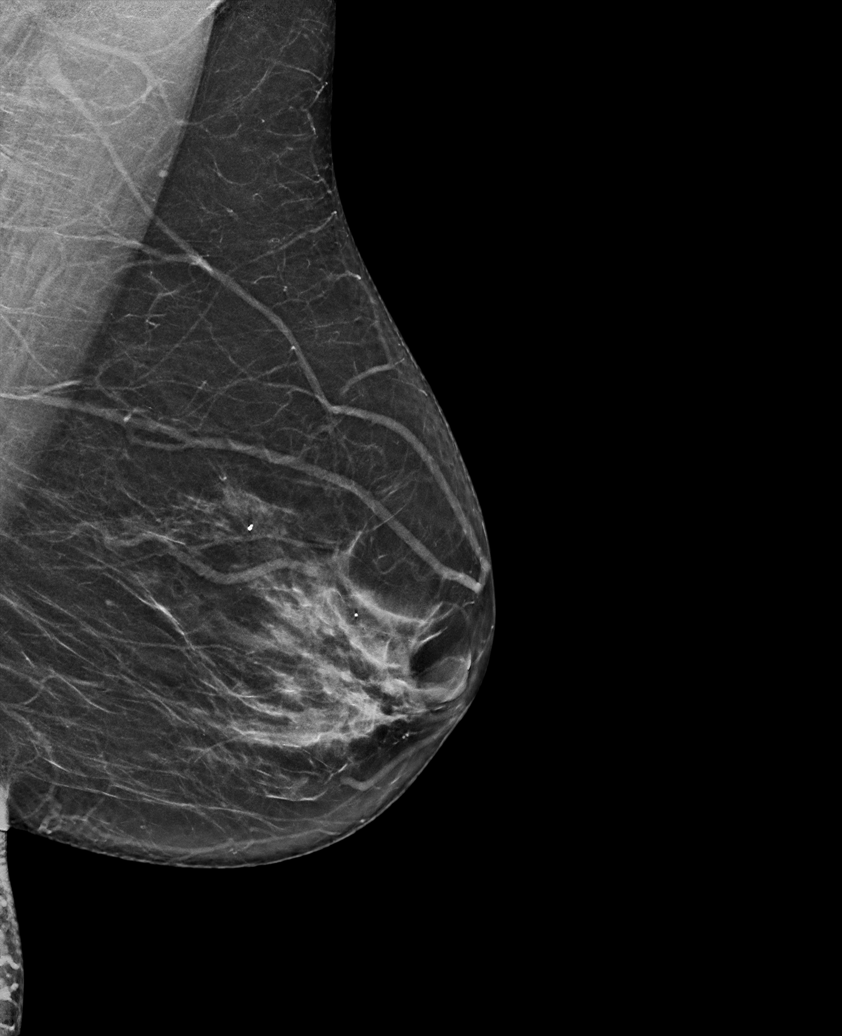

[R XCCL synth-2D]
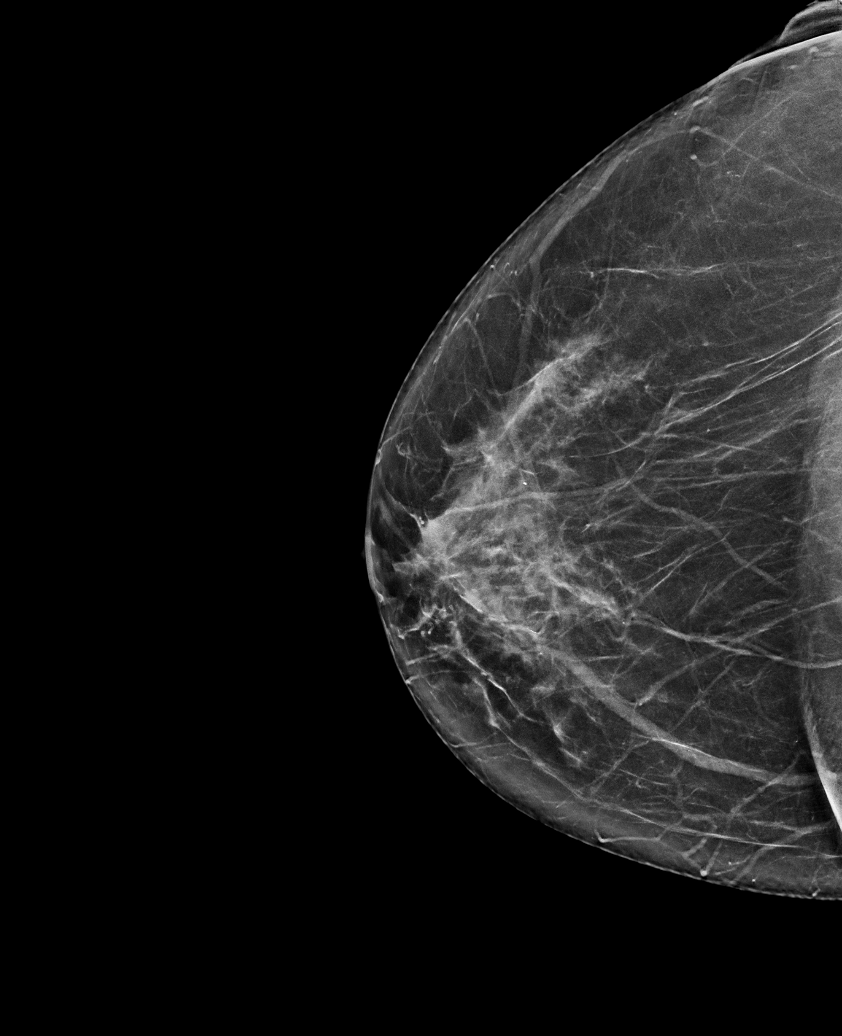

[R MLO synth-2D]
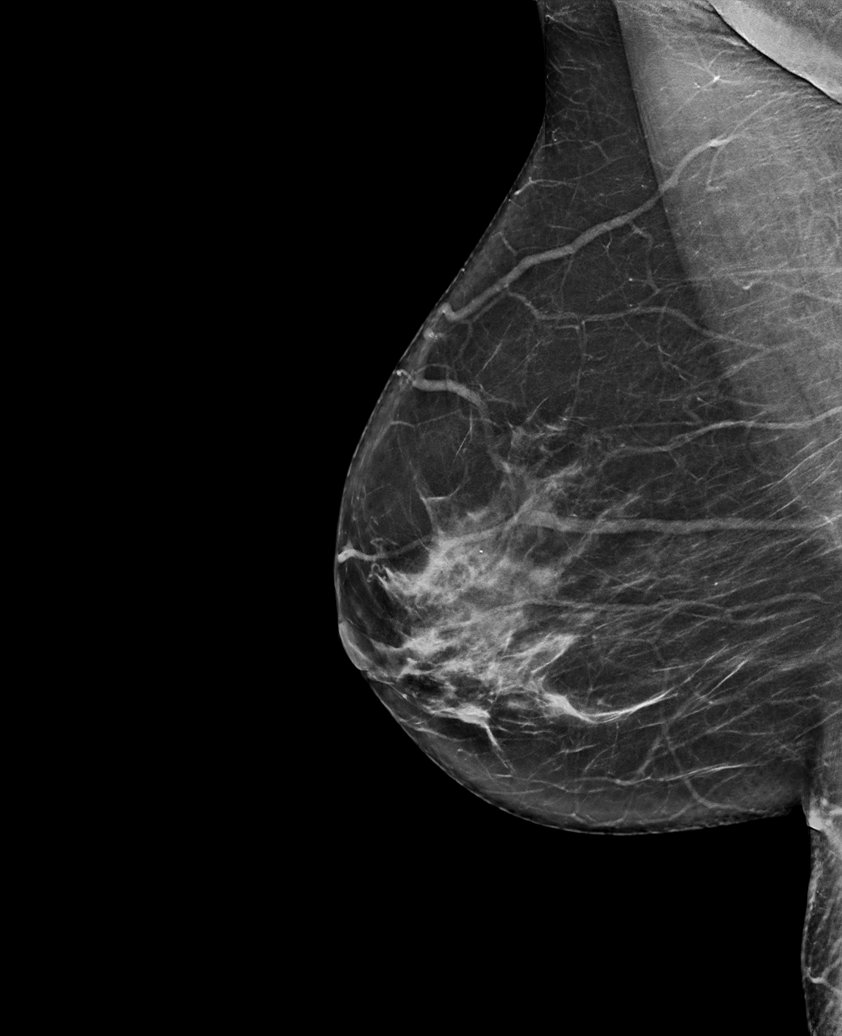

[R CC synth-2D]
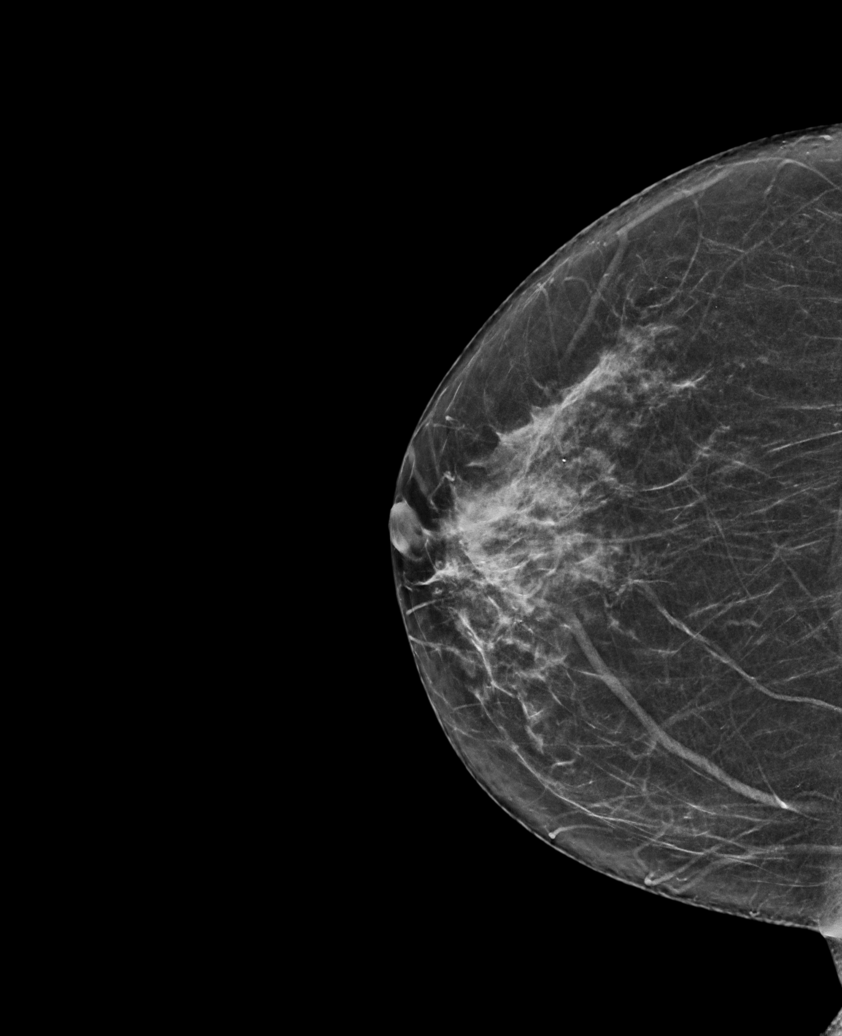

[R XCCL tomo · tomo slice 35/70.0]
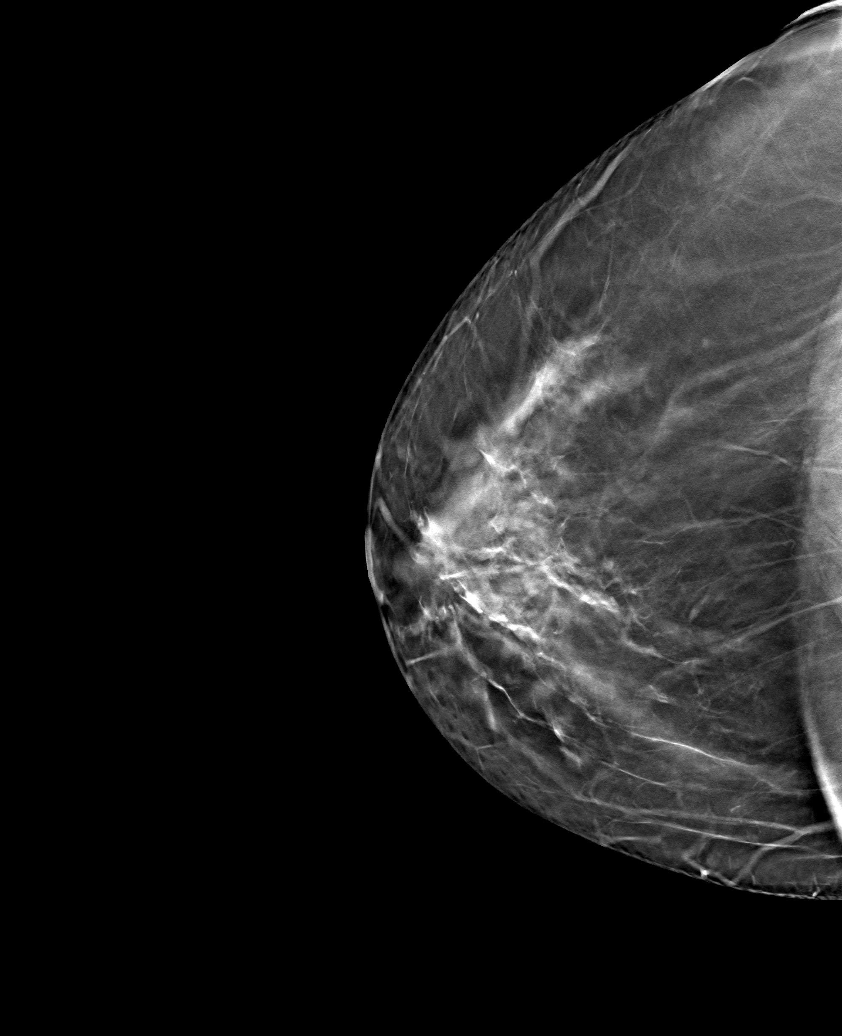

[6 of 30 positions shown; findings below may reference images not displayed]

ACR Breast Density Category b: There are scattered areas of
fibroglandular density.
FINDINGS: In the right breast, possible distortion warrants further
evaluation. In the left breast, no findings suspicious for
malignancy. Images were processed with CAD.
IMPRESSION: Further evaluation is suggested for possible distortion in the right
breast.

RECOMMENDATION:
Diagnostic mammogram and possibly ultrasound of the right breast.
(Code:D0-N-MMR)

The patient will be contacted regarding the findings, and additional
imaging will be scheduled.

BI-RADS CATEGORY  0: Incomplete. Need additional imaging evaluation
and/or prior mammograms for comparison.

## 2022-06-08 ENCOUNTER — Ambulatory Visit (INDEPENDENT_AMBULATORY_CARE_PROVIDER_SITE_OTHER): Payer: BLUE CROSS/BLUE SHIELD | Admitting: Physician Assistant

## 2022-06-08 VITALS — BP 146/93 | HR 90 | Ht 68.0 in | Wt 223.0 lb

## 2022-06-08 DIAGNOSIS — R928 Other abnormal and inconclusive findings on diagnostic imaging of breast: Secondary | ICD-10-CM

## 2022-06-08 DIAGNOSIS — Z1329 Encounter for screening for other suspected endocrine disorder: Secondary | ICD-10-CM

## 2022-06-08 DIAGNOSIS — Z0000 Encounter for general adult medical examination without abnormal findings: Secondary | ICD-10-CM

## 2022-06-08 DIAGNOSIS — G894 Chronic pain syndrome: Secondary | ICD-10-CM

## 2022-06-08 DIAGNOSIS — Z131 Encounter for screening for diabetes mellitus: Secondary | ICD-10-CM

## 2022-06-08 DIAGNOSIS — Z23 Encounter for immunization: Secondary | ICD-10-CM

## 2022-06-08 DIAGNOSIS — Z13220 Encounter for screening for lipoid disorders: Secondary | ICD-10-CM

## 2022-06-08 DIAGNOSIS — K2100 Gastro-esophageal reflux disease with esophagitis, without bleeding: Secondary | ICD-10-CM

## 2022-06-08 DIAGNOSIS — T07XXXA Unspecified multiple injuries, initial encounter: Secondary | ICD-10-CM

## 2022-06-08 DIAGNOSIS — Z780 Asymptomatic menopausal state: Secondary | ICD-10-CM

## 2022-06-08 MED ORDER — GABAPENTIN 100 MG PO CAPS: ORAL_CAPSULE | ORAL | 0 refills | Status: DC

## 2022-06-08 MED ORDER — OMEPRAZOLE 40 MG PO CPDR: 40.0000 mg | DELAYED_RELEASE_CAPSULE | Freq: Every day | ORAL | 3 refills | Status: DC

## 2022-06-08 NOTE — Patient Instructions (Addendum)
[facility] referral for mammogram.  Will refer to GI to follow up on GERD. Multiple fractures will get bone density  Contrave- pill  Wegovy and Zepbound  Health Maintenance, Female Adopting a healthy lifestyle and getting preventive care are important in promoting health and wellness. Ask your health care provider about: The right schedule for you to have regular tests and exams. Things you can do on your own to prevent diseases and keep yourself healthy. What should I know about diet, weight, and exercise? Eat a healthy diet   Eat a diet that includes plenty of vegetables, fruits, low-fat dairy products, and lean protein. Do not eat a lot of foods that are high in solid fats, added sugars, or sodium. Maintain a healthy weight Body mass index (BMI) is used to identify weight problems. It estimates body fat based on height and weight. Your health care provider can help determine your BMI and help you achieve or maintain a healthy weight. Get regular exercise Get regular exercise. This is one of the most important things you can do for your health. Most adults should: Exercise for at least 150 minutes each week. The exercise should increase your heart rate and make you sweat (moderate-intensity exercise). Do strengthening exercises at least twice a week. This is in addition to the moderate-intensity exercise. Spend less time sitting. Even light physical activity can be beneficial. Watch cholesterol and blood lipids Have your blood tested for lipids and cholesterol at 55 years of age, then have this test every 5 years. Have your cholesterol levels checked more often if: Your lipid or cholesterol levels are high. You are older than 55 years of age. You are at high risk for heart disease. What should I know about cancer screening? Depending on your health history and family history, you may need to have cancer screening at various ages. This may include screening for: Breast  cancer. Cervical cancer. Colorectal cancer. Skin cancer. Lung cancer. What should I know about heart disease, diabetes, and high blood pressure? Blood pressure and heart disease High blood pressure causes heart disease and increases the risk of stroke. This is more likely to develop in people who have high blood pressure readings or are overweight. Have your blood pressure checked: Every 3-5 years if you are 24-58 years of age. Every year if you are 80 years old or older. Diabetes Have regular diabetes screenings. This checks your fasting blood sugar level. Have the screening done: Once every three years after age 20 if you are at a normal weight and have a low risk for diabetes. More often and at a younger age if you are overweight or have a high risk for diabetes. What should I know about preventing infection? Hepatitis B If you have a higher risk for hepatitis B, you should be screened for this virus. Talk with your health care provider to find out if you are at risk for hepatitis B infection. Hepatitis C Testing is recommended for: Everyone born from [birth year] through 1964. Anyone with known risk factors for hepatitis C. Sexually transmitted infections (STIs) Get screened for STIs, including gonorrhea and chlamydia, if: You are sexually active and are younger than 55 years of age. You are older than 55 years of age and your health care provider tells you that you are at risk for this type of infection. Your sexual activity has changed since you were last screened, and you are at increased risk for chlamydia or gonorrhea. Ask your health care provider if you  are at risk. Ask your health care provider about whether you are at high risk for HIV. Your health care provider may recommend a prescription medicine to help prevent HIV infection. If you choose to take medicine to prevent HIV, you should first get tested for HIV. You should then be tested every 3 months for as long as you are taking  the medicine. Pregnancy If you are about to stop having your period (premenopausal) and you may become pregnant, seek counseling before you get pregnant. Take 400 to 800 micrograms (mcg) of folic acid every day if you become pregnant. Ask for birth control (contraception) if you want to prevent pregnancy. Osteoporosis and menopause Osteoporosis is a disease in which the bones lose minerals and strength with aging. This can result in bone fractures. If you are 66 years old or older, or if you are at risk for osteoporosis and fractures, ask your health care provider if you should: Be screened for bone loss. Take a calcium or vitamin D supplement to lower your risk of fractures. Be given hormone replacement therapy (HRT) to treat symptoms of menopause. Follow these instructions at home: Alcohol use Do not drink alcohol if: Your health care provider tells you not to drink. You are pregnant, may be pregnant, or are planning to become pregnant. If you drink alcohol: Limit how much you have to: 0-1 drink a day. Know how much alcohol is in your drink. In the [place] one drink equals one 12 oz bottle of beer (355 mL), one 5 oz glass of wine (148 mL), or one 1 oz glass of hard liquor (44 mL). Lifestyle Do not use any products that contain nicotine or tobacco. These products include cigarettes, chewing tobacco, and vaping devices, such as e-cigarettes. If you need help quitting, ask your health care provider. Do not use street drugs. Do not share needles. Ask your health care provider for help if you need support or information about quitting drugs. General instructions Schedule regular health, dental, and eye exams. Stay current with your vaccines. Tell your health care provider if: You often feel depressed. You have ever been abused or do not feel safe at home. Summary Adopting a healthy lifestyle and getting preventive care are important in promoting health and wellness. Follow your health  care provider's instructions about healthy diet, exercising, and getting tested or screened for diseases. Follow your health care provider's instructions on monitoring your cholesterol and blood pressure. This information is not intended to replace advice given to you by your health care provider. Make sure you discuss any questions you have with your health care provider. Document Revised: 11/18/2020 Document Reviewed: 11/18/2020 Elsevier Patient Education  2023 [employer].

## 2022-06-08 NOTE — Progress Notes (Signed)
Complete physical exam  Patient: Katrina Cole   DOB: 04-16-1966   56 y.o. Female  MRN: [id number]  Subjective:    Chief Complaint  Patient presents with   Annual Exam    Katrina Cole is a 56 y.o. female who presents today for a complete physical exam. She reports consuming a general diet. The patient does not participate in regular exercise at present. She generally feels fairly well. She reports sleeping fairly well. She does have additional problems to discuss today.   Pt concerned with multiple fractures over the past year. She is on omeprazole daily and needs rx. Right now getting OTC.  Last mammogram 2021 never followed up on abnormal right breast mammogram. No family hx of BC.  She is in chronic pain in multiple joints. She has been told it's arthritis. Taking NsAIds as needed.   Most recent fall risk assessment:    12/05/2019   10:46 AM  Fall Risk   Falls in the past year? 1  Number falls in past yr: 1  Injury with Fall? 0  Follow up Falls evaluation completed     Most recent depression screenings:    06/08/2022    8:44 AM 06/10/2021   11:12 AM  PHQ 2/9 Scores  PHQ - 2 Score 2 0  PHQ- 9 Score 4     Vision:Within last year and Dental: No current dental problems and Receives regular dental care  Patient Active Problem List   Diagnosis Date Noted   Abnormal mammogram of right breast 06/08/2022   Gastroesophageal reflux disease with esophagitis without hemorrhage 06/08/2022   Chronic pain syndrome 06/08/2022   Post-menopausal 06/08/2022   Multiple fractures 06/08/2022   Fracture of one rib, right side, initial encounter for closed fracture 04/13/2022   Influenza 07/02/2021   Obesity 06/10/2021   Radiculitis of left cervical region 04/25/2021   Chronic pain of left knee 12/16/2020   Anxiety 07/31/2020   Insomnia 07/31/2020   Tibialis posterior tendinitis, left 05/03/2020   Posterior vaginal wall prolapse 01/22/2020   Lumbar degenerative disc disease  01/17/2020   Chronic right foot and ankle pain 12/27/2019   Fracture of distal phalanx of right middle finger 11/01/2019   Past Medical History:  Diagnosis Date   DDD (degenerative disc disease), lumbar    GERD (gastroesophageal reflux disease)    Family History  Problem Relation Age of Onset   COPD Mother    Hypertension Father    Allergies  Allergen Reactions   Penicillins Anaphylaxis      Patient Care Team: Christen Butter, NP as PCP - General (Nurse Practitioner)   Outpatient Medications Prior to Visit  Medication Sig   [DISCONTINUED] omeprazole (PRILOSEC) 40 MG capsule Take 1 capsule (40 mg total) by mouth daily. **NO MORE REFILLS UNTIL PATIENT COMES IN FOR AN OFFICE VISIT**   [DISCONTINUED] oxyCODONE-acetaminophen (PERCOCET) 5-325 MG tablet Take 0.5-1 tablets by mouth every 4 (four) hours as needed for severe pain.   No facility-administered medications prior to visit.    ROS  See HPI.      Objective:     BP (!) 146/93   Pulse 90   Ht 5\' 8"  (1.727 m)   Wt 223 lb (101.2 kg)   LMP  (LMP Unknown)   SpO2 98%   BMI 33.91 kg/m  BP Readings from Last 3 Encounters:  06/08/22 (!) 146/93  05/29/22 (!) 157/97  04/10/22 (!) 142/96   Wt Readings from Last 3 Encounters:  06/08/22 223 lb (101.2  kg)  05/29/22 220 lb (99.8 kg)  04/13/22 218 lb (98.9 kg)      Physical Exam Vitals reviewed.  Constitutional:      Appearance: Normal appearance. She is obese.  HENT:     Head: Normocephalic.     Right Ear: Tympanic membrane, ear canal and external ear normal. There is no impacted cerumen.     Left Ear: Ear canal and external ear normal. There is no impacted cerumen.     Nose: Nose normal. No congestion or rhinorrhea.     Mouth/Throat:     Mouth: Mucous membranes are moist.     Pharynx: No oropharyngeal exudate.  Eyes:     Extraocular Movements: Extraocular movements intact.     Conjunctiva/sclera: Conjunctivae normal.     Pupils: Pupils are equal, round, and  reactive to light.  Cardiovascular:     Rate and Rhythm: Normal rate and regular rhythm.     Pulses: Normal pulses.     Heart sounds: Normal heart sounds.  Pulmonary:     Effort: Pulmonary effort is normal.     Breath sounds: Normal breath sounds.  Abdominal:     General: Bowel sounds are normal.     Palpations: Abdomen is soft.  Musculoskeletal:     Cervical back: Normal range of motion and neck supple. No tenderness.     Right lower leg: No edema.     Left lower leg: No edema.  Lymphadenopathy:     Cervical: No cervical adenopathy.  Skin:    Findings: No rash.  Neurological:     General: No focal deficit present.     Mental Status: She is alert and oriented to person, place, and time.  Psychiatric:        Mood and Affect: Mood normal.         Assessment & Plan:    Routine Health Maintenance and Physical Exam  Immunization History  Administered Date(s) Administered   Influenza,inj,Quad PF,6+ Mos 06/08/2022   Janssen (J&J) SARS-COV-2 Vaccination 10/19/2019   PFIZER Comirnaty(Gray Top)Covid-19 Tri-Sucrose Vaccine 12/12/2020   Pfizer Covid-19 Vaccine Bivalent Booster 72yrs & up 12/12/2020, 06/04/2021   Tdap 12/12/2015   Zoster Recombinat (Shingrix) 06/08/2022    Health Maintenance  Topic Date Due   MAMMOGRAM  01/30/2022   COVID-19 Vaccine (5 - 2023-24 season) 06/24/2022 (Originally 03/13/2022)   Hepatitis C Screening  07/03/2025 (Originally 02/21/1984)   Zoster Vaccines- Shingrix (2 of 2) 08/03/2022   PAP SMEAR-Modifier  01/22/2023   COLONOSCOPY (Pts 45-52yrs Insurance coverage will need to be confirmed)  07/03/2025   INFLUENZA VACCINE  Completed   HPV VACCINES  Aged Out   HIV Screening  Discontinued    Discussed health benefits of physical activity, and encouraged her to engage in regular exercise appropriate for her age and condition.  Katrina KitchenNoreene Larsson was seen today for annual exam.  Diagnoses and all orders for this visit:  Routine physical examination -     Lipid  Panel w/reflex Direct LDL -     COMPLETE METABOLIC PANEL WITH GFR -     TSH -     CBC w/Diff/Platelet -     VITAMIN D 25 Hydroxy (Vit-D Deficiency, Fractures)  Abnormal mammogram of right breast -     MM Digital Diagnostic Bilat -     US BREAST COMPLETE UNI RIGHT INC AXILLA  Need for shingles vaccine -     Varicella-zoster vaccine IM  Needs flu shot -     Flu Vaccine QUAD  27mo+IM (Fluarix, Fluzone & Alfiuria Quad PF)  Gastroesophageal reflux disease with esophagitis without hemorrhage -     omeprazole (PRILOSEC) 40 MG capsule; Take 1 capsule (40 mg total) by mouth daily. -     Ambulatory referral to Gastroenterology  Multiple fractures -     DG Bone Density; Future -     CBC w/Diff/Platelet -     VITAMIN D 25 Hydroxy (Vit-D Deficiency, Fractures)  Screening for diabetes mellitus -     COMPLETE METABOLIC PANEL WITH GFR  Screening for lipid disorders -     Lipid Panel w/reflex Direct LDL  Post-menopausal -     VITAMIN D 25 Hydroxy (Vit-D Deficiency, Fractures)  Thyroid disorder screen -     TSH  Chronic pain syndrome -     gabapentin (NEURONTIN) 100 MG capsule; One tab PO qHS for a week, then BID for a week, then TID.    Discussed 150 minutes of exercise a week.  Encouraged vitamin D 1000 units and Calcium 1300mg  or 4 servings of dairy a day.  Fasting labs ordered On PPI with hx of fractures ordered bone density Hx of right abnormal screening mammogram-diagnostic ordered Flu and shingles given today. Covid booster declined  BP not to goal. Start monitoring at home.  Low salt diet. Follow up in 1-2 months.   Chronic OA pain Worried about too many NSAIDs Consider tumeric Start gabapentin Follow up in 1-2 months with PCP  Referral to GI for GERD and omeprazole follow up.         , PA-C

## 2022-06-09 LAB — COMPLETE METABOLIC PANEL WITH GFR
AG Ratio: 1.6 (calc) (ref 1.0–2.5)
ALT: 15 U/L (ref 6–29)
AST: 18 U/L (ref 10–35)
Albumin: 4.3 g/dL (ref 3.6–5.1)
Alkaline phosphatase (APISO): 75 U/L (ref 37–153)
BUN: 17 mg/dL (ref 7–25)
CO2: 28 mmol/L (ref 20–32)
Calcium: 9.7 mg/dL (ref 8.6–10.4)
Chloride: 104 mmol/L (ref 98–110)
Creat: 0.71 mg/dL (ref 0.50–1.03)
Globulin: 2.7 g/dL (calc) (ref 1.9–3.7)
Glucose, Bld: 97 mg/dL (ref 65–99)
Potassium: 4.4 mmol/L (ref 3.5–5.3)
Sodium: 140 mmol/L (ref 135–146)
Total Bilirubin: 0.8 mg/dL (ref 0.2–1.2)
Total Protein: 7 g/dL (ref 6.1–8.1)
eGFR: 100 mL/min/{1.73_m2} (ref >=60)

## 2022-06-09 LAB — LIPID PANEL W/REFLEX DIRECT LDL
Cholesterol: 266 mg/dL — ABNORMAL HIGH (ref <200)
HDL: 91 mg/dL (ref >=50)
LDL Cholesterol (Calc): 154 mg/dL (calc) — ABNORMAL HIGH
Non-HDL Cholesterol (Calc): 175 mg/dL (calc) — ABNORMAL HIGH (ref <130)
Total CHOL/HDL Ratio: 2.9 (calc) (ref <5.0)
Triglycerides: 97 mg/dL (ref <150)

## 2022-06-09 LAB — CBC WITH DIFFERENTIAL/PLATELET
Absolute Monocytes: 550 cells/uL (ref 200–950)
Basophils Absolute: 61 cells/uL (ref 0–200)
Basophils Relative: 1.1 %
Eosinophils Absolute: 138 cells/uL (ref 15–500)
Eosinophils Relative: 2.5 %
HCT: 40.4 % (ref 35.0–45.0)
Hemoglobin: 13.7 g/dL (ref 11.7–15.5)
Lymphs Abs: 2321 cells/uL (ref 850–3900)
MCH: 31.3 pg (ref 27.0–33.0)
MCHC: 33.9 g/dL (ref 32.0–36.0)
MCV: 92.2 fL (ref 80.0–100.0)
MPV: 10.7 fL (ref 7.5–12.5)
Monocytes Relative: 10 %
Neutro Abs: 2431 cells/uL (ref 1500–7800)
Neutrophils Relative %: 44.2 %
Platelets: 248 10*3/uL (ref 140–400)
RBC: 4.38 10*6/uL (ref 3.80–5.10)
RDW: 12.5 % (ref 11.0–15.0)
Total Lymphocyte: 42.2 %
WBC: 5.5 10*3/uL (ref 3.8–10.8)

## 2022-06-09 LAB — VITAMIN D 25 HYDROXY (VIT D DEFICIENCY, FRACTURES): Vit D, 25-Hydroxy: 27 ng/mL — ABNORMAL LOW (ref 30–100)

## 2022-06-09 LAB — TSH: TSH: 1.63 mIU/L

## 2022-06-11 NOTE — Progress Notes (Signed)
Katrina Cole, I am covering for [facility] while she is out of the office.  Your total cholesterol and LDL are elevated in fact they went up compared to 2 years ago.  Just encouraged to continue to work on healthy diet and regular exercise to reduce your cholesterol numbers which will reduce your risk for cardiovascular disease.  Your vitamin D level was low.  If you are not currently taking a supplement I would recommend 25 mcg daily.  We can always recheck your level in about 2 to 3 months to make sure that you are absorbing the medication well.  All other labs look normal.  It looks like your blood pressure was quite elevated while you are here for your physical.  We would like for you to schedule a nurse visit in about 2 weeks to recheck your blood pressure to see if it has improved or if this needs to be addressed further.  The 10-year ASCVD risk score (Arnett DK, et al., 2019) is: 2.1%   Values used to calculate the score:     Age: 56 years     Sex: Female     Is Non-Hispanic African American: No     Diabetic: No     Tobacco smoker: No     Systolic Blood Pressure: 146 mmHg     Is BP treated: No     HDL Cholesterol: 91 mg/dL     Total Cholesterol: 266 mg/dL

## 2022-06-25 ENCOUNTER — Ambulatory Visit: Payer: BLUE CROSS/BLUE SHIELD

## 2022-06-26 ENCOUNTER — Ambulatory Visit (INDEPENDENT_AMBULATORY_CARE_PROVIDER_SITE_OTHER): Payer: BLUE CROSS/BLUE SHIELD | Admitting: Medical-Surgical

## 2022-06-26 VITALS — BP 144/86 | HR 87

## 2022-06-26 DIAGNOSIS — I10 Essential (primary) hypertension: Secondary | ICD-10-CM

## 2022-06-26 MED ORDER — HYDROCHLOROTHIAZIDE 12.5 MG PO CAPS: 12.5000 mg | ORAL_CAPSULE | Freq: Every day | ORAL | 1 refills | Status: DC

## 2022-06-26 NOTE — Progress Notes (Signed)
Pt presents to clinic today for BP check.   At her last OV her BP was 146/93 she was asked to RTC in 2 weeks for recheck.   Pt 1st bp reading was elevated will allow her to sit before rechecking.

## 2022-06-26 NOTE — Patient Instructions (Addendum)
Please pick up the hydrochlorothiazide 12.5 mg from the pharmacy today and start taking this daily.    If you can afford to purchase a blood pressure cuff that measures your blood pressure on your upper arm.   Come back in 2 weeks with the blood pressure cuff and the readings to see the nurse and have labs done.

## 2022-06-26 NOTE — Progress Notes (Signed)
Agree with documentation as below. BP continues to be elevated. Start HCTZ 12.5mg  daily. Would like for her to monitor BP at home with a goal of 130/80 or less. Recommend low sodium diet, regular intentional exercise, and maintaining a healthy weight. Return in 2 weeks for NV for BP check. If she has been able to obtain a BP cuff, bring it with her to verify accuracy.  ___________________________________________ Thayer Ohm, DNP, APRN, FNP-BC Primary Care and Sports Medicine [facility] [place]

## 2022-07-02 ENCOUNTER — Ambulatory Visit (INDEPENDENT_AMBULATORY_CARE_PROVIDER_SITE_OTHER): Payer: BLUE CROSS/BLUE SHIELD

## 2022-07-02 ENCOUNTER — Ambulatory Visit (INDEPENDENT_AMBULATORY_CARE_PROVIDER_SITE_OTHER): Payer: BLUE CROSS/BLUE SHIELD | Admitting: Sports Medicine

## 2022-07-02 DIAGNOSIS — S92902A Unspecified fracture of left foot, initial encounter for closed fracture: Secondary | ICD-10-CM | POA: Diagnosis not present

## 2022-07-02 MED ORDER — HYDROCODONE-ACETAMINOPHEN 10-325 MG PO TABS: 1.0000 | ORAL_TABLET | Freq: Three times a day (TID) | ORAL | 0 refills | Status: DC | PRN

## 2022-07-02 NOTE — Progress Notes (Signed)
    Procedures performed today:    None.  Independent interpretation of notes and tests performed by another provider:   None.  Brief History, Exam, Impression, and Recommendations:    Fracture, foot, left, closed, initial encounter Pleasant 56 year old female, foot injury, actually kicked a wheelchair 6 days ago, has some swelling, pain, bruising. Pain is localized third metatarsal neck, fourth proximal phalanx. Postop shoe, hydrocodone, x-rays. She works at [employer] so she will be out of work for 6 weeks. I do expect some disability paperwork and she understands to make an appointment to fill this out.    ____________________________________________ Ihor Austin. Benjamin Stain, M.D., ABFM., CAQSM., AME. Primary Care and Sports Medicine [facility]  Adjunct Professor of Family Medicine  [place] [employer] [facility] [employer] [employer]  Restaurant manager, fast food

## 2022-07-02 NOTE — Assessment & Plan Note (Signed)
Pleasant 56 year old female, foot injury, actually kicked a wheelchair 6 days ago, has some swelling, pain, bruising. Pain is localized third metatarsal neck, fourth proximal phalanx. Postop shoe, hydrocodone, x-rays. She works at [employer] so she will be out of work for 6 weeks. I do expect some disability paperwork and she understands to make an appointment to fill this out.

## 2022-07-03 ENCOUNTER — Ambulatory Visit: Payer: BLUE CROSS/BLUE SHIELD

## 2022-07-08 ENCOUNTER — Ambulatory Visit (INDEPENDENT_AMBULATORY_CARE_PROVIDER_SITE_OTHER): Payer: BLUE CROSS/BLUE SHIELD

## 2022-07-08 ENCOUNTER — Encounter: Payer: Self-pay | Admitting: Physician Assistant

## 2022-07-08 DIAGNOSIS — T07XXXA Unspecified multiple injuries, initial encounter: Secondary | ICD-10-CM

## 2022-07-08 DIAGNOSIS — Z780 Asymptomatic menopausal state: Secondary | ICD-10-CM | POA: Diagnosis not present

## 2022-07-08 DIAGNOSIS — M8580 Other specified disorders of bone density and structure, unspecified site: Secondary | ICD-10-CM | POA: Insufficient documentation

## 2022-07-08 NOTE — Progress Notes (Signed)
Bone density shows osteopenia( low bone mass) continue with vitamin D and calcium supplementation and regular low weight bearing exercise. Recheck bone density in 2 years.

## 2022-07-09 ENCOUNTER — Other Ambulatory Visit: Payer: Self-pay | Admitting: Sports Medicine

## 2022-07-09 DIAGNOSIS — S92902A Unspecified fracture of left foot, initial encounter for closed fracture: Secondary | ICD-10-CM

## 2022-07-09 MED ORDER — HYDROCODONE-ACETAMINOPHEN 10-325 MG PO TABS: 1.0000 | ORAL_TABLET | Freq: Three times a day (TID) | ORAL | 0 refills | Status: DC | PRN

## 2022-07-10 ENCOUNTER — Encounter: Payer: Self-pay | Admitting: Sports Medicine

## 2022-07-10 ENCOUNTER — Ambulatory Visit (INDEPENDENT_AMBULATORY_CARE_PROVIDER_SITE_OTHER): Payer: BLUE CROSS/BLUE SHIELD

## 2022-07-10 ENCOUNTER — Ambulatory Visit (INDEPENDENT_AMBULATORY_CARE_PROVIDER_SITE_OTHER): Payer: BLUE CROSS/BLUE SHIELD | Admitting: Sports Medicine

## 2022-07-10 VITALS — BP 169/105 | HR 90 | Wt 229.0 lb

## 2022-07-10 DIAGNOSIS — S99911A Unspecified injury of right ankle, initial encounter: Secondary | ICD-10-CM | POA: Diagnosis not present

## 2022-07-10 DIAGNOSIS — S92902A Unspecified fracture of left foot, initial encounter for closed fracture: Secondary | ICD-10-CM

## 2022-07-10 DIAGNOSIS — S82831A Other fracture of upper and lower end of right fibula, initial encounter for closed fracture: Secondary | ICD-10-CM

## 2022-07-10 NOTE — Progress Notes (Addendum)
    Procedures performed today:    None.  Independent interpretation of notes and tests performed by another provider:   None.  Brief History, Exam, Impression, and Recommendations:    Fracture, foot, left, closed, initial encounter Kemisha returns, she is now approximately 1-1/2 to 2 weeks status post fracture fourth proximal phalanx, left foot after kicking a wheelchair by accident. Fracture was minimally displaced, tolerances are acceptable. She was placed in a postop shoe and is doing okay. I would like updated x-rays. I did fill out her disability paperwork today.  Closed avulsion fracture of distal fibula, right, initial encounter [place] also accidentally inverted her right ankle while walking her dog, she has pain, swelling, bruising, pain is directly over the lateral malleolus, adding x-rays, she will get a lace up ankle brace herself. She has plenty of pain medicine.  Update: X-rays do show avulsion tip of the lateral malleolus this can be treated like a severe sprain.  Continue ASO for now.    ____________________________________________ Debby PARAS. Curtis, M.D., ABFM., CAQSM., AME. Primary Care and Sports Medicine [facility]  Adjunct Professor of [facility] [facility] Medicine  [employer]  Restaurant Manager, [employer]

## 2022-07-10 NOTE — Assessment & Plan Note (Signed)
Katrina Cole returns, she is now approximately 1-1/2 to 2 weeks status post fracture fourth proximal phalanx, left foot after kicking a wheelchair by accident. Fracture was minimally displaced, tolerances are acceptable. She was placed in a postop shoe and is doing okay. I would like updated x-rays. I did fill out her disability paperwork today.

## 2022-07-10 NOTE — Assessment & Plan Note (Addendum)
Katrina Cole also accidentally inverted her right ankle while walking her dog, she has pain, swelling, bruising, pain is directly over the lateral malleolus, adding x-rays, she will get a lace up ankle brace herself. She has plenty of pain medicine.  Update: X-rays do show avulsion tip of the lateral malleolus this can be treated like a severe sprain.  Continue ASO for now.

## 2022-07-15 ENCOUNTER — Other Ambulatory Visit: Payer: Self-pay | Admitting: Sports Medicine

## 2022-07-15 DIAGNOSIS — S92902A Unspecified fracture of left foot, initial encounter for closed fracture: Secondary | ICD-10-CM

## 2022-07-16 MED ORDER — HYDROCODONE-ACETAMINOPHEN 10-325 MG PO TABS: 1.0000 | ORAL_TABLET | Freq: Three times a day (TID) | ORAL | 0 refills | Status: DC | PRN

## 2022-07-17 ENCOUNTER — Encounter: Payer: Self-pay | Admitting: Sports Medicine

## 2022-07-17 NOTE — Telephone Encounter (Signed)
She needs an appointment to fill out disability paperwork

## 2022-07-21 ENCOUNTER — Ambulatory Visit: Payer: BLUE CROSS/BLUE SHIELD | Admitting: Sports Medicine

## 2022-07-23 ENCOUNTER — Other Ambulatory Visit: Payer: Self-pay | Admitting: Sports Medicine

## 2022-07-23 ENCOUNTER — Encounter: Payer: Self-pay | Admitting: Sports Medicine

## 2022-07-23 ENCOUNTER — Ambulatory Visit: Payer: BLUE CROSS/BLUE SHIELD | Admitting: Sports Medicine

## 2022-07-23 VITALS — BP 142/90 | HR 94

## 2022-07-23 DIAGNOSIS — S92902A Unspecified fracture of left foot, initial encounter for closed fracture: Secondary | ICD-10-CM | POA: Diagnosis not present

## 2022-07-23 MED ORDER — HYDROCODONE-ACETAMINOPHEN 10-325 MG PO TABS: ORAL_TABLET | ORAL | 0 refills | Status: DC

## 2022-07-23 NOTE — Assessment & Plan Note (Signed)
Katrina Cole returns, she is now approximately 3 weeks status post fracture through the fourth proximal phalanx left foot after kicking a wheelchair by accident. Continues with the postop shoe. Updated x-rays at the last visit showed stability. We filled out additional disability paperwork today with a return to work date of September 27, 2022. She already has a follow-up scheduled with me.

## 2022-07-23 NOTE — Progress Notes (Signed)
    Procedures performed today:    None.  Independent interpretation of notes and tests performed by another provider:   None.  Brief History, Exam, Impression, and Recommendations:    Fracture, foot, left, closed, initial encounter Katrina Cole returns, she is now approximately 3 weeks status post fracture through the fourth proximal phalanx left foot after kicking a wheelchair by accident. Continues with the postop shoe. Updated x-rays at the last visit showed stability. We filled out additional disability paperwork today with a return to work date of September 27, 2022. She already has a follow-up scheduled with me.    ____________________________________________ Gwen Her. Dianah Field, M.D., ABFM., CAQSM., AME. Primary Care and Sports Medicine [facility]  Adjunct Professor of [place] [employer] of Medicine  Risk manager

## 2022-07-24 ENCOUNTER — Ambulatory Visit: Payer: BLUE CROSS/BLUE SHIELD | Admitting: Sports Medicine

## 2022-07-29 ENCOUNTER — Encounter: Payer: Self-pay | Admitting: Sports Medicine

## 2022-07-30 ENCOUNTER — Encounter: Payer: Self-pay | Admitting: Sports Medicine

## 2022-07-31 ENCOUNTER — Telehealth: Payer: Self-pay

## 2022-07-31 DIAGNOSIS — S92902A Unspecified fracture of left foot, initial encounter for closed fracture: Secondary | ICD-10-CM

## 2022-07-31 NOTE — Telephone Encounter (Signed)
Called and notified patient

## 2022-07-31 NOTE — Telephone Encounter (Signed)
Initiated Prior authorization [id number] 10-325MG  tablets Via: Covermymeds Case/Key:[id number] Status: approved as of 07/31/21 Reason:approved through 08/14/2022 Notified Pt via: Mychart   Pt has optum rx Bin:[id number] Id:[id number]

## 2022-08-02 MED ORDER — HYDROCODONE-ACETAMINOPHEN 10-325 MG PO TABS: ORAL_TABLET | ORAL | 0 refills | Status: DC

## 2022-08-02 NOTE — Addendum Note (Signed)
Addended by: Silverio Decamp on: 08/02/2022 08:41 PM   Modules accepted: Orders

## 2022-08-02 NOTE — Telephone Encounter (Signed)
Sent!

## 2022-08-06 ENCOUNTER — Ambulatory Visit: Payer: BLUE CROSS/BLUE SHIELD | Admitting: Sports Medicine

## 2022-08-06 ENCOUNTER — Other Ambulatory Visit: Payer: BLUE CROSS/BLUE SHIELD

## 2022-08-07 ENCOUNTER — Ambulatory Visit: Payer: BLUE CROSS/BLUE SHIELD | Admitting: Sports Medicine

## 2022-08-10 ENCOUNTER — Encounter: Payer: Self-pay | Admitting: Physician Assistant

## 2022-08-10 ENCOUNTER — Telehealth (INDEPENDENT_AMBULATORY_CARE_PROVIDER_SITE_OTHER): Payer: BLUE CROSS/BLUE SHIELD | Admitting: Physician Assistant

## 2022-08-10 VITALS — BP 138/82 | Temp 101.0°F | Ht 68.0 in | Wt 225.0 lb

## 2022-08-10 DIAGNOSIS — J0100 Acute maxillary sinusitis, unspecified: Secondary | ICD-10-CM | POA: Diagnosis not present

## 2022-08-10 MED ORDER — AZITHROMYCIN 250 MG PO TABS: ORAL_TABLET | ORAL | 0 refills | Status: DC

## 2022-08-10 NOTE — Progress Notes (Signed)
Started Thursday Tightness/aching in chest Fever  Mainly pain/pressure in head  Taking Sudafed/Nyquil  Has not Covid tested  (Son was sick two days before)

## 2022-08-10 NOTE — Progress Notes (Signed)
..Virtual Visit via Telephone Note  I connected with Katrina Cole on 08/10/22 at  1:00 PM EST by telephone and verified that I am speaking with the correct person using two identifiers.  Location: Patient: home Provider: clinic  .Marland KitchenParticipating in visit:  Patient: Katrina Cole Provider: Iran Planas PA-C Provider in training: Katrina Caraway PA-S   I discussed the limitations, risks, security and privacy concerns of performing an evaluation and management service by telephone and the availability of in person appointments. I also discussed with the patient that there may be a patient responsible charge related to this service. The patient expressed understanding and agreed to proceed.   History of Present Illness: Pt is a 57 yo female who calls in with 5 days of fever, chills, sinus pressure, headache, teeth ache, sinus drainage, and ear pain. Not tested for covid or flu. Taking theraflu and OTC medications. Fever spiked again this morning. She has had flu and covid shot. Son was sick 2 days before her but better and she is not. She feels like she has a sinus infection.   .. Active Ambulatory Problems    Diagnosis Date Noted   Fracture of distal phalanx of right middle finger 11/01/2019   Chronic right foot and ankle pain 12/27/2019   Lumbar degenerative disc disease 01/17/2020   Posterior vaginal wall prolapse 01/22/2020   Tibialis posterior tendinitis, left 05/03/2020   Anxiety 07/31/2020   Insomnia 07/31/2020   Chronic pain of left knee 12/16/2020   Radiculitis of left cervical region 04/25/2021   Obesity 06/10/2021   Influenza 07/02/2021   Fracture of one rib, right side, initial encounter for closed fracture 04/13/2022   Abnormal mammogram of right breast 06/08/2022   Gastroesophageal reflux disease with esophagitis without hemorrhage 06/08/2022   Chronic pain syndrome 06/08/2022   Post-menopausal 06/08/2022   Multiple fractures 06/08/2022   Fracture, foot, left, closed, initial  encounter 07/02/2022   Osteopenia 07/08/2022   Closed avulsion fracture of distal fibula, right, initial encounter 07/10/2022   Resolved Ambulatory Problems    Diagnosis Date Noted   No Resolved Ambulatory Problems   Past Medical History:  Diagnosis Date   DDD (degenerative disc disease), lumbar    GERD (gastroesophageal reflux disease)        Observations/Objective: No acute distress Normal breathing Normal mood  . Today's Vitals   08/10/22 1153  BP: 138/82  Temp: (!) 101 F (38.3 C)  TempSrc: Oral  Weight: 225 lb (102.1 kg)  Height: 5\' 8"  (1.727 m)   Body mass index is 34.21 kg/m.    Assessment and Plan: .Marland KitchenOmunique was seen today for nasal congestion and cough.  Diagnoses and all orders for this visit:  Acute non-recurrent maxillary sinusitis -     azithromycin (ZITHROMAX Z-PAK) 250 MG tablet; Take 2 tablets (500 mg) on  Day 1,  followed by 1 tablet (250 mg) once daily on Days 2 through 5.   ? Covid but does not have test Allergy to PCN Sent zpak to use with flonase Consider sinus nasal wash Tylenol and ibuprofen for fever and body aches Follow up as needed or if symptoms persist.    Follow Up Instructions:    I discussed the assessment and treatment plan with the patient. The patient was provided an opportunity to ask questions and all were answered. The patient agreed with the plan and demonstrated an understanding of the instructions.   The patient was advised to call back or seek an in-person evaluation if the symptoms worsen  or if the condition fails to improve as anticipated.  I provided 10 minutes of non-face-to-face time during this encounter.   Katrina Planas, PA-C

## 2022-08-12 ENCOUNTER — Ambulatory Visit: Payer: BLUE CROSS/BLUE SHIELD | Admitting: Sports Medicine

## 2022-08-12 DIAGNOSIS — S92902A Unspecified fracture of left foot, initial encounter for closed fracture: Secondary | ICD-10-CM

## 2022-08-12 NOTE — Progress Notes (Signed)
    Procedures performed today:    None.  Independent interpretation of notes and tests performed by another provider:   None.  Brief History, Exam, Impression, and Recommendations:    Fracture, foot, left, closed, initial encounter Katrina Cole returns, she is now approximately 6 weeks post fracture through the fourth proximal phalanx left foot after kicking a wheelchair by accident, she has been consistent with the postop shoe. X-rays historically have shown stability. She is cleared to be out of work until February 27. Her daughter is having a Solicitor. She has a normal exam with only minimal tenderness at the fracture site. I will keep her out for the full amount of time until February 27 at which point she may return to work without restrictions, she can go into a regular shoe now, continue ASO on the right side for 6 more weeks.    ____________________________________________ Gwen Her. Dianah Field, M.D., ABFM., CAQSM., AME. Primary Care and Sports Medicine [facility]  Adjunct Professor of [employer]  Risk manager

## 2022-08-12 NOTE — Assessment & Plan Note (Signed)
Katrina Cole returns, she is now approximately 6 weeks post fracture through the fourth proximal phalanx left foot after kicking a wheelchair by accident, she has been consistent with the postop shoe. X-rays historically have shown stability. She is cleared to be out of work until February 27. Her daughter is having a Solicitor. She has a normal exam with only minimal tenderness at the fracture site. I will keep her out for the full amount of time until February 27 at which point she may return to work without restrictions, she can go into a regular shoe now, continue ASO on the right side for 6 more weeks.

## 2022-08-18 ENCOUNTER — Ambulatory Visit: Payer: BLUE CROSS/BLUE SHIELD

## 2022-08-18 ENCOUNTER — Ambulatory Visit
Admission: RE | Admit: 2022-08-18 | Discharge: 2022-08-18 | Disposition: A | Discharge status: Home / Self Care | Payer: BLUE CROSS/BLUE SHIELD | Source: Ambulatory Visit | Attending: Physician Assistant | Admitting: Physician Assistant

## 2022-08-18 NOTE — Progress Notes (Signed)
Normal mammogram. Follow up in 1 year.

## 2022-08-20 ENCOUNTER — Other Ambulatory Visit: Payer: Self-pay | Admitting: Sports Medicine

## 2022-08-20 DIAGNOSIS — S92902A Unspecified fracture of left foot, initial encounter for closed fracture: Secondary | ICD-10-CM

## 2022-08-24 NOTE — Telephone Encounter (Signed)
Message sent to patient via Mychart that prescription was denied.

## 2022-09-02 ENCOUNTER — Encounter: Payer: Self-pay | Admitting: Obstetrics and Gynecology

## 2022-09-02 ENCOUNTER — Ambulatory Visit: Payer: BLUE CROSS/BLUE SHIELD | Admitting: Obstetrics and Gynecology

## 2022-09-02 VITALS — BP 137/77 | HR 86 | Ht 68.0 in | Wt 227.0 lb

## 2022-09-02 DIAGNOSIS — N816 Rectocele: Secondary | ICD-10-CM

## 2022-09-02 NOTE — Progress Notes (Unsigned)
   NEW GYNECOLOGY VISIT  Subjective:  Katrina Cole is a 57 y.o. menopausal G5P4 with history of endometrial ablation presenting for prolapse symptoms  Has known posterior compartment prolapse for 20+ years, but is now becoming bothersome. Has bulge sensation & pressure worse with lifting, coughing, etc. Sometimes needs to splint to have bowel movements. 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 Denies constipation/diarrhea.   Denies any urinary complaints, no bothersome leakage.   Objective:   Vitals:   09/02/22 0950  BP: 137/77  Pulse: 86  Weight: 227 lb (103 kg)  Height: 5' 8"$  (1.727 m)    General:  Alert, oriented and cooperative. Patient is in no acute distress.  Skin: Skin is warm and dry. No rash noted.   Cardiovascular: Normal heart rate noted  Respiratory: Normal respiratory effort, no problems with respiration noted  Abdomen: Soft, non tender, non distended  POP-Q:   Assessment and Plan:  Katrina Cole is a 57 y.o. with stage II uterovaginal prolapse  1. Rectocele Reviewed role for management of any constipation, PFPT Discussed options for pessary and surgical management. Reviewed that surgical management would be performed by urogynecologist and could include a native tissue repair vs apical suspension with mesh. Reviewed risk of recurrence even with surgical repair.  After discussion, pt is most interested in surgical repair and is not interested in trying a pessary.  - Ambulatory referral to Urogynecology  Future Appointments  Date Time Provider [place]  09/08/2022  8:10 AM Samuel Bouche, NP [facility] None   Inez Catalina, MD

## 2022-09-08 ENCOUNTER — Ambulatory Visit: Payer: BLUE CROSS/BLUE SHIELD | Admitting: Medical-Surgical

## 2022-09-08 ENCOUNTER — Encounter: Payer: Self-pay | Admitting: Medical-Surgical

## 2022-09-08 VITALS — BP 136/91 | HR 79 | Resp 20 | Ht 68.0 in | Wt 224.6 lb

## 2022-09-08 DIAGNOSIS — Z23 Encounter for immunization: Secondary | ICD-10-CM

## 2022-09-08 DIAGNOSIS — K2100 Gastro-esophageal reflux disease with esophagitis, without bleeding: Secondary | ICD-10-CM

## 2022-09-08 DIAGNOSIS — I10 Essential (primary) hypertension: Secondary | ICD-10-CM

## 2022-09-08 DIAGNOSIS — G894 Chronic pain syndrome: Secondary | ICD-10-CM | POA: Diagnosis not present

## 2022-09-08 MED ORDER — HYDROCHLOROTHIAZIDE 12.5 MG PO CAPS: 12.5000 mg | ORAL_CAPSULE | Freq: Every day | ORAL | 1 refills | Status: DC

## 2022-09-08 MED ORDER — GABAPENTIN 100 MG PO CAPS: 200.0000 mg | ORAL_CAPSULE | Freq: Two times a day (BID) | ORAL | 1 refills | Status: DC | PRN

## 2022-09-08 MED ORDER — FAMOTIDINE 20 MG PO TABS: 20.0000 mg | ORAL_TABLET | Freq: Two times a day (BID) | ORAL | 1 refills | Status: DC

## 2022-09-08 MED ORDER — OMEPRAZOLE 20 MG PO CPDR: 20.0000 mg | DELAYED_RELEASE_CAPSULE | Freq: Every day | ORAL | 11 refills | Status: DC

## 2022-09-08 NOTE — Progress Notes (Signed)
Established Patient Office Visit  Subjective   Patient ID: Katrina Cole, female   DOB: 10-04-1965 Age: 57 y.o. MRN: [id number]   Chief Complaint  Patient presents with   Follow-up   Hypertension    HPI Pleasant 57 year old female presenting today for the following:  Hypertension: Not currently taking any medications although she was prescribed hydrochlorothiazide 12.5 mg daily.  She never filled this prescription as she did not think her high blood pressure was a medical problem and was simply related to stress.  Has not been checking blood pressures at home.  Following a low-sodium diet.  Not currently exercising.  Normally works at a physically active job however has been out for several months due to injuries.  Reports that she is going back to work today.  Pain: Was previously taking gabapentin 200 mg at night which she found helpful with her overall pain levels.  Unfortunately, she ran out of the medication and has not been on anything.  She is requesting refill today.  GERD: Had DEXA scan which showed some bone loss.  Has been treated with omeprazole 40 mg daily for 15 years.  Unfortunately, she has a severe recurrence of symptoms if she misses a dose or tries to take a smaller dose of the omeprazole.  Is interested in other options that may help but will not cause further bone loss.   Objective:    Vitals:   09/08/22 0813 09/08/22 0852  BP: (!) 154/90 (!) 136/91  Pulse: 79   Resp: 20   Height: '5\' 8"'$  (1.727 m)   Weight: 224 lb 9.6 oz (101.9 kg)   SpO2: 98%   BMI (Calculated): 34.16     Physical Exam Vitals reviewed.  Constitutional:      General: She is not in acute distress.    Appearance: Normal appearance. She is obese. She is not ill-appearing.  HENT:     Head: Normocephalic and atraumatic.  Cardiovascular:     Rate and Rhythm: Normal rate and regular rhythm.     Pulses: Normal pulses.     Heart sounds: Normal heart sounds.  Pulmonary:     Effort: Pulmonary  effort is normal. No respiratory distress.     Breath sounds: Normal breath sounds. No wheezing, rhonchi or rales.  Skin:    General: Skin is warm and dry.  Neurological:     Mental Status: She is alert and oriented to person, place, and time.  Psychiatric:        Mood and Affect: Mood normal.        Behavior: Behavior normal.        Thought Content: Thought content normal.        Judgment: Judgment normal.   No results found for this or any previous visit (from the past 24 hour(s)).     The 10-year ASCVD risk score (Arnett DK, et al., 2019) is: 2.5%   Values used to calculate the score:     Age: 14 years     Sex: Female     Is Non-Hispanic African American: No     Diabetic: No     Tobacco smoker: No     Systolic Blood Pressure: [id number] mmHg     Is BP treated: Yes     HDL Cholesterol: 91 mg/dL     Total Cholesterol: 266 mg/dL   Assessment & Plan:   1. Primary hypertension Discussed the risks of uncontrolled blood pressure.  This may be temporary due to sedentary  behaviors, obesity, and need for dietary modification.  Discussed the importance of controlling blood pressure to prevent cardiovascular damage in the long run.  Patient is agreeable so restarting hydrochlorothiazide 12.5 mg daily. - hydrochlorothiazide (MICROZIDE) 12.5 MG capsule; Take 1 capsule (12.5 mg total) by mouth daily.  Dispense: 90 capsule; Refill: 1  2. Gastroesophageal reflux disease with esophagitis without hemorrhage Decrease omeprazole to 20 mg daily.  Adding famotidine 20 mg twice daily as needed.  3. Chronic pain syndrome Refilling gabapentin. - gabapentin (NEURONTIN) 100 MG capsule; Take 2 capsules (200 mg total) by mouth 2 (two) times daily as needed.  Dispense: 360 capsule; Refill: 1  4. Need for shingles vaccine Shingrix No. 2 given in office today. - Zoster Recombinant (Shingrix )   Return in about 6 months (around 03/09/2023) for chronic disease follow  up.  ___________________________________________ Clearnce Sorrel, DNP, APRN, FNP-BC Primary Care and [place]

## 2022-10-02 ENCOUNTER — Other Ambulatory Visit: Payer: Self-pay | Admitting: Medical-Surgical

## 2022-11-27 ENCOUNTER — Other Ambulatory Visit: Payer: BLUE CROSS/BLUE SHIELD

## 2023-03-11 ENCOUNTER — Ambulatory Visit (INDEPENDENT_AMBULATORY_CARE_PROVIDER_SITE_OTHER): Payer: BLUE CROSS/BLUE SHIELD | Admitting: Medical-Surgical

## 2023-03-11 DIAGNOSIS — Z91199 Patient's noncompliance with other medical treatment and regimen due to unspecified reason: Secondary | ICD-10-CM

## 2023-03-11 NOTE — Progress Notes (Signed)
   Complete physical exam  Patient: Katrina Cole   DOB: 05/02/1998   57 y.o. Female  MRN: [id number]  Subjective:    No chief complaint on file.   Katrina Cole is a 57 y.o. female who presents today for a complete physical exam. She reports consuming a {diet types:17450} diet. {types:19826} She generally feels {DESC; WELL/FAIRLY WELL/POORLY:18703}. She reports sleeping {DESC; WELL/FAIRLY WELL/POORLY:18703}. She {does/does not:200015} have additional problems to discuss today.    Most recent fall risk assessment:    01/07/2022   10:42 AM  Fall Risk   Falls in the past year? 0  Number falls in past yr: 0  Injury with Fall? 0  Risk for fall due to : No Fall Risks  Follow up Falls evaluation completed     Most recent depression screenings:    01/07/2022   10:42 AM 11/28/2020   10:46 AM  PHQ 2/9 Scores  PHQ - 2 Score 0 0  PHQ- 9 Score 5     {VISON DENTAL STD PSA (Optional):27386}  {History (Optional):23778}  Patient Care Team: Jessup, Joy, NP as PCP - General (Nurse Practitioner)   Outpatient Medications Prior to Visit  Medication Sig   fluticasone (FLONASE) 50 MCG/ACT nasal spray Place 2 sprays into both nostrils in the morning and at bedtime. After 7 days, reduce to once daily.   norgestimate-ethinyl estradiol (SPRINTEC 28) 0.25-35 MG-MCG tablet Take 1 tablet by mouth daily.   Nystatin POWD Apply liberally to affected area 2 times per day   spironolactone (ALDACTONE) 100 MG tablet Take 1 tablet (100 mg total) by mouth daily.   No facility-administered medications prior to visit.    ROS        Objective:     There were no vitals taken for this visit. {Vitals History (Optional):23777}  Physical Exam   No results found for any visits on 02/12/22. {Show previous labs (optional):23779}    Assessment & Plan:    Routine Health Maintenance and Physical Exam  Immunization History  Administered Date(s) Administered   DTaP 07/16/1999, 09/11/1999,  11/20/1999, 08/05/2000, 02/19/2004   Hepatitis A 12/16/2007, 12/21/2008   Hepatitis B 05/03/1999, 06/10/1999, 11/20/1999   HiB (PRP-OMP) 07/16/1999, 09/11/1999, 11/20/1999, 08/05/2000   IPV 07/16/1999, 09/11/1999, 05/10/2000, 02/19/2004   Influenza,inj,Quad PF,6+ Mos 03/23/2014   Influenza-Unspecified 06/22/2012   MMR 05/10/2001, 02/19/2004   Meningococcal Polysaccharide 12/21/2011   Pneumococcal Conjugate-13 08/05/2000   Pneumococcal-Unspecified 11/20/1999, 02/03/2000   Tdap 12/21/2011   Varicella 05/10/2000, 12/16/2007    Health Maintenance  Topic Date Due   HIV Screening  Never done   Hepatitis C Screening  Never done   INFLUENZA VACCINE  02/10/2022   PAP-Cervical Cytology Screening  02/12/2022 (Originally 05/01/2020)   PAP SMEAR-Modifier  02/12/2022 (Originally 05/01/2020)   TETANUS/TDAP  02/12/2022 (Originally 12/20/2021)   HPV VACCINES  Discontinued   COVID-19 Vaccine  Discontinued    Discussed health benefits of physical activity, and encouraged her to engage in regular exercise appropriate for her age and condition.  Problem List Items Addressed This Visit   None Visit Diagnoses     Annual physical exam    -  Primary   Cervical cancer screening       Need for Tdap vaccination          No follow-ups on file.     Joy Jessup, NP   

## 2023-03-19 ENCOUNTER — Ambulatory Visit: Payer: BLUE CROSS/BLUE SHIELD | Admitting: Medical-Surgical

## 2023-03-19 ENCOUNTER — Ambulatory Visit: Payer: BLUE CROSS/BLUE SHIELD | Attending: Medical-Surgical

## 2023-03-19 ENCOUNTER — Encounter: Payer: Self-pay | Admitting: Medical-Surgical

## 2023-03-19 ENCOUNTER — Ambulatory Visit: Payer: BLUE CROSS/BLUE SHIELD | Admitting: Obstetrics and Gynecology

## 2023-03-19 VITALS — BP 118/77 | HR 95 | Resp 20 | Ht 68.0 in | Wt 227.2 lb

## 2023-03-19 DIAGNOSIS — I10 Essential (primary) hypertension: Secondary | ICD-10-CM | POA: Diagnosis not present

## 2023-03-19 DIAGNOSIS — E6609 Other obesity due to excess calories: Secondary | ICD-10-CM | POA: Diagnosis not present

## 2023-03-19 DIAGNOSIS — R002 Palpitations: Secondary | ICD-10-CM

## 2023-03-19 DIAGNOSIS — F419 Anxiety disorder, unspecified: Secondary | ICD-10-CM

## 2023-03-19 DIAGNOSIS — Z6834 Body mass index (BMI) 34.0-34.9, adult: Secondary | ICD-10-CM

## 2023-03-19 DIAGNOSIS — R5383 Other fatigue: Secondary | ICD-10-CM

## 2023-03-19 DIAGNOSIS — E559 Vitamin D deficiency, unspecified: Secondary | ICD-10-CM

## 2023-03-19 MED ORDER — SERTRALINE HCL 50 MG PO TABS
ORAL_TABLET | ORAL | 3 refills | Status: DC
Start: 2023-03-19 — End: 2023-04-30

## 2023-03-19 MED ORDER — HYDROXYZINE HCL 50 MG PO TABS
25.0000 mg | ORAL_TABLET | Freq: Three times a day (TID) | ORAL | 3 refills | Status: DC | PRN
Start: 2023-03-19 — End: 2023-11-16

## 2023-03-19 NOTE — Progress Notes (Unsigned)
Enrolled patient for a 7 day Zio XT monitor to be mailed to patients home   DOD to read 

## 2023-03-19 NOTE — Progress Notes (Signed)
        Established patient visit  History, exam, impression, and plan:  1. Primary hypertension Katrina Cole 57 year old female presenting today for follow-up on hypertension.  She is taking hydrochlorothiazide 12.5 mg daily, tolerating well without side effects.  Does not monitor blood pressure at home as she does not have a machine to do so.  Avoids processed food and tends to cook from scratch.  Is not currently exercising.  Endorses frequent palpitations, shortness of breath, and dyspnea on exertion.  Denies chest pain, unusual headaches, dizziness, syncopal episodes, and significant lower extremity edema.  HRRR, S1/S2 normal.  Mild lower extremity edema noted bilaterally.  Lungs CTA.  Respirations even and unlabored.  Checking labs as below.  Blood pressure is well-controlled in office today.  Continue hydrochlorothiazide 12.5 mg daily as prescribed. - CBC with Differential/Platelet - CMP14+EGFR  2. Class 1 obesity due to excess calories with serious comorbidity and body mass index (BMI) of 34.0 to 34.9 in adult Patient aware of recommendations for weight loss.  She is working on dietary modifications.  Recommend adding regular intentional exercise to her daily to do this.  Checking hemoglobin A1c. - Hemoglobin A1c  3. Fatigue, unspecified type Continues to endorse significant fatigue.  Unclear etiology although I do suspect stress and anxiety is the main contributor.  Plan to check labs as below to rule out metabolic cause. - CBC with Differential/Platelet - Iron, TIBC and Ferritin Panel - Cortisol  4. Palpitations Unclear etiology of palpitations.  She experiences this on a regular basis and feels like her heart is racing out of her chest.  This is accompanied by shortness of breath/dyspnea on exertion but could certainly be related to feelings of panic and anxiety that she has been experiencing.  Plan to check labs as below.  In office EKG completed showing heart rate of 87 with normal  sinus rhythm, no acute changes and normal axis.  Plan for long-term monitor for further evaluation. - TSH - CMP14+EGFR - EKG; Future - Magnesium - LONG TERM MONITOR (3-14 DAYS); Future  5. Anxiety Significant stress and anxiety noted and discussed during our appointment.  She is not currently on any medications for management of this but is interested in starting.  She previously took hydroxyzine which worked well for moments of panic and would like to have this back.  She also is interested in starting a maintenance medication so plan to start sertraline 25 mg daily for 1 week then increase to 50 mg daily.  Adding hydroxyzine as needed as requested. - CMP14+EGFR - hydrOXYzine (ATARAX) 50 MG tablet; Take 0.5-1 tablets (25-50 mg total) by mouth 3 (three) times daily as needed for anxiety.  Dispense: 30 tablet; Refill: 3 - sertraline (ZOLOFT) 50 MG tablet; Take 1/2 tablet (25mg ) daily for one week then increase to 1 full tablet (50mg ) daily.  Dispense: 30 tablet; Refill: 3  6. Vitamin D insufficiency History of vitamin D insufficiency.  She is taking a calcium and vitamin D supplement every day.  Rechecking vitamin D levels today. - VITAMIN D 25 Hydroxy (Vit-D Deficiency, Fractures)  Procedures performed this visit: None.  Return in about 6 weeks (around 04/30/2023) for mood follow up.  __________________________________ Thayer Ohm, DNP, APRN, FNP-BC Primary Care and Sports Medicine [facility]

## 2023-03-20 LAB — HEMOGLOBIN A1C
Est. average glucose Bld gHb Est-mCnc: 117 mg/dL
Hgb A1c MFr Bld: 5.7 % — ABNORMAL HIGH (ref 4.8–5.6)

## 2023-03-20 LAB — CMP14+EGFR
ALT: 18 IU/L (ref 0–32)
AST: 25 IU/L (ref 0–40)
Albumin: 4.4 g/dL (ref 3.8–4.9)
Alkaline Phosphatase: 95 IU/L (ref 44–121)
BUN/Creatinine Ratio: 21 (ref 9–23)
BUN: 17 mg/dL (ref 6–24)
Bilirubin Total: 0.3 mg/dL (ref 0.0–1.2)
CO2: 21 mmol/L (ref 20–29)
Calcium: 9.7 mg/dL (ref 8.7–10.2)
Chloride: 103 mmol/L (ref 96–106)
Creatinine, Ser: 0.81 mg/dL (ref 0.57–1.00)
Globulin, Total: 2.4 g/dL (ref 1.5–4.5)
Glucose: 105 mg/dL — ABNORMAL HIGH (ref 70–99)
Potassium: 4.1 mmol/L (ref 3.5–5.2)
Sodium: 141 mmol/L (ref 134–144)
Total Protein: 6.8 g/dL (ref 6.0–8.5)
eGFR: 85 mL/min/{1.73_m2} (ref >59)

## 2023-03-20 LAB — CBC WITH DIFFERENTIAL/PLATELET
Basophils Absolute: 0.1 10*3/uL (ref 0.0–0.2)
Basos: 1 %
EOS (ABSOLUTE): 0.3 10*3/uL (ref 0.0–0.4)
Eos: 4 %
Hematocrit: 41.1 % (ref 34.0–46.6)
Hemoglobin: 13.9 g/dL (ref 11.1–15.9)
Immature Grans (Abs): 0 10*3/uL (ref 0.0–0.1)
Immature Granulocytes: 0 %
Lymphocytes Absolute: 2.5 10*3/uL (ref 0.7–3.1)
Lymphs: 35 %
MCH: 30.6 pg (ref 26.6–33.0)
MCHC: 33.8 g/dL (ref 31.5–35.7)
MCV: 91 fL (ref 79–97)
Monocytes Absolute: 0.7 10*3/uL (ref 0.1–0.9)
Monocytes: 9 %
Neutrophils Absolute: 3.7 10*3/uL (ref 1.4–7.0)
Neutrophils: 51 %
Platelets: 293 10*3/uL (ref 150–450)
RBC: 4.54 x10E6/uL (ref 3.77–5.28)
RDW: 12.6 % (ref 11.7–15.4)
WBC: 7.2 10*3/uL (ref 3.4–10.8)

## 2023-03-20 LAB — MAGNESIUM: Magnesium: 2.3 mg/dL (ref 1.6–2.3)

## 2023-03-20 LAB — IRON,TIBC AND FERRITIN PANEL
Ferritin: 111 ng/mL (ref 15–150)
Iron Saturation: 24 % (ref 15–55)
Iron: 71 ug/dL (ref 27–159)
Total Iron Binding Capacity: 291 ug/dL (ref 250–450)
UIBC: 220 ug/dL (ref 131–425)

## 2023-03-20 LAB — VITAMIN D 25 HYDROXY (VIT D DEFICIENCY, FRACTURES): Vit D, 25-Hydroxy: 31.5 ng/mL (ref 30.0–100.0)

## 2023-03-20 LAB — TSH: TSH: 0.985 u[IU]/mL (ref 0.450–4.500)

## 2023-03-20 LAB — CORTISOL: Cortisol: 7.3 ug/dL (ref 6.2–19.4)

## 2023-03-31 DIAGNOSIS — R002 Palpitations: Secondary | ICD-10-CM

## 2023-04-01 NOTE — Addendum Note (Signed)
Addended by: Chalmers Cater on: 04/01/2023 11:29 AM   Modules accepted: Orders

## 2023-04-02 ENCOUNTER — Other Ambulatory Visit: Payer: Self-pay | Admitting: Medical-Surgical

## 2023-04-02 DIAGNOSIS — F419 Anxiety disorder, unspecified: Secondary | ICD-10-CM

## 2023-04-30 ENCOUNTER — Ambulatory Visit: Payer: BLUE CROSS/BLUE SHIELD

## 2023-04-30 ENCOUNTER — Encounter: Payer: Self-pay | Admitting: Medical-Surgical

## 2023-04-30 ENCOUNTER — Ambulatory Visit: Payer: BLUE CROSS/BLUE SHIELD | Admitting: Medical-Surgical

## 2023-04-30 VITALS — BP 146/88 | HR 81 | Resp 20 | Ht 68.0 in | Wt 211.0 lb

## 2023-04-30 DIAGNOSIS — I10 Essential (primary) hypertension: Secondary | ICD-10-CM | POA: Diagnosis not present

## 2023-04-30 DIAGNOSIS — M19011 Primary osteoarthritis, right shoulder: Secondary | ICD-10-CM

## 2023-04-30 DIAGNOSIS — R002 Palpitations: Secondary | ICD-10-CM | POA: Diagnosis not present

## 2023-04-30 DIAGNOSIS — G894 Chronic pain syndrome: Secondary | ICD-10-CM

## 2023-04-30 DIAGNOSIS — F419 Anxiety disorder, unspecified: Secondary | ICD-10-CM | POA: Diagnosis not present

## 2023-04-30 DIAGNOSIS — M25511 Pain in right shoulder: Secondary | ICD-10-CM

## 2023-04-30 MED ORDER — CYCLOBENZAPRINE HCL 10 MG PO TABS
5.0000 mg | ORAL_TABLET | Freq: Three times a day (TID) | ORAL | 0 refills | Status: DC | PRN
Start: 2023-04-30 — End: 2023-11-16

## 2023-04-30 MED ORDER — SERTRALINE HCL 50 MG PO TABS
50.0000 mg | ORAL_TABLET | Freq: Every day | ORAL | 3 refills | Status: DC
Start: 2023-04-30 — End: 2023-11-16

## 2023-04-30 MED ORDER — KETOROLAC TROMETHAMINE 60 MG/2ML IM SOLN
60.0000 mg | Freq: Once | INTRAMUSCULAR | Status: AC
Start: 2023-04-30 — End: 2023-04-30
  Administered 2023-04-30: 60 mg via INTRAMUSCULAR

## 2023-04-30 MED ORDER — PREDNISONE 50 MG PO TABS: 50.0000 mg | ORAL_TABLET | Freq: Every day | ORAL | 0 refills | Status: DC

## 2023-04-30 NOTE — Progress Notes (Signed)
Established patient visit  History, exam, impression, and plan:  1. Primary hypertension Pleasant 57 year old female presenting today with follow-up on hypertension.  She is currently taking hydrochlorothiazide 12.5 mg daily, tolerating well without side effects.  Notes that she is lost approximately 16 pounds since her last visit and has been more active.  She is feeling better overall.  Not regularly checking blood pressures.  Has started monitoring her sodium intake and trying to aim for lower sodium options.  Denies concerning symptoms today.  Notes her palpitations have actually improved along with the other improvements she has noticed.  Blood pressure is slightly elevated on arrival but suspect this is due to acute pain and her excitement over reporting her progress.  Continue hydrochlorothiazide 12.5 mg daily.  Recommend monitoring blood pressure at home at least 2-3 times weekly with a goal of 130/80 or less.  2. Palpitations Previous issues with palpitations have reportedly improved over the last [redacted] weeks along with her other improvements.  She did have some SVT on her long-term monitor so we discussed this.  She would like to have a referral to cardiology for further evaluation and to have someone on hand should her symptoms return or worsen.  Referral placed. - Ambulatory referral to Cardiology  3. Anxiety She was  started on Zoloft and is now taking 50 mg daily, tolerating very well without side effects.  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  Only had to take a couple of doses of hydroxyzine early in the 6 weeks but has not needed it since then.  Has noticed that she is tolerating situational stressors much better than she was previously.  Happy with the results and does not desire dose change at this time.  Denies SI/HI.  Continue sertraline. - sertraline (ZOLOFT) 50 MG tablet; Take 1 tablet (50 mg total) by mouth daily.  Dispense: 90  tablet; Refill: 3  4. Chronic pain syndrome With her weight loss, she is now moving more and feeling better overall.  Her pain in her legs and feet has improved and she is very happy with the result.  Continue above plans and continue working on weight loss efforts.  5. Acute pain of right shoulder On Sunday, she was reaching above her head and felt like she overextended her right shoulder.  She had immediate pain in the shoulder joint and has had difficulty raising her arm.  When she does try to raise her arm, the pain is very sharp and stabbing however it aches all the time.  She has tried multiple things including Tiger balm, lidocaine patches, Tylenol, and heat but has not had any good relief of symptoms.  On exam, she has significant limitation of abduction and flexion at the shoulder joint.  She is exquisitely tender over the upper part of the rotator cuff and to the upper deltoid over the bursa.  Consider bursitis, shoulder impingement, or rotator cuff tear.  Toradol 60 mg IM x 1 in office for pain.  Adding prednisone 50 mg daily x 5 days.  Flexeril at night to help with sleep but okay to use this as needed during the day.  Advised this will make her drowsy so monitor its effects before trying to work or drive.  Home exercises printed and provided for completion.  Getting x-rays today.  Possible need for advanced imaging however I would like for her to do 6 weeks of conservative therapy.  If this is not improving or if it continues to worsen, return for follow-up with Dr. Karie Schwalbe. - DG Shoulder Right; Future - ketorolac (TORADOL) injection 60 mg  Procedures performed this visit: None.  Return in about 6 months (around 10/29/2023) for Hypertension/mood follow-up.  __________________________________ Thayer Ohm, DNP, APRN, FNP-BC Primary Care and Sports Medicine [facility] [place]

## 2023-07-04 ENCOUNTER — Other Ambulatory Visit: Payer: Self-pay | Admitting: Medical-Surgical

## 2023-07-20 ENCOUNTER — Other Ambulatory Visit: Payer: Self-pay | Admitting: Medical-Surgical

## 2023-07-20 DIAGNOSIS — I10 Essential (primary) hypertension: Secondary | ICD-10-CM

## 2023-08-03 NOTE — Progress Notes (Deleted)
 Referring-Joy Larinda Buttery, NP Reason for referral-palpitations  HPI: 58 year old female for evaluation of palpitations at request of Christen Butter, NP.  Monitor October 2024 showed 4 runs of SVT longest lasting 8 beats; occasional PACs and rare PVCs/couplets.  Patient triggered events associated with sinus rhythm, PACs and PVCs.  Laboratory September 2024 showed magnesium 2.3, creatinine 0.81, potassium 4.1, TSH 0.985, hemoglobin 13.9.  Current Outpatient Medications  Medication Sig Dispense Refill   cyclobenzaprine (FLEXERIL) 10 MG tablet Take 0.5-1 tablets (5-10 mg total) by mouth 3 (three) times daily as needed for muscle spasms. 30 tablet 0   famotidine (PEPCID) 20 MG tablet TAKE 1 TABLET BY MOUTH TWICE A DAY 180 tablet 1   hydrochlorothiazide (MICROZIDE) 12.5 MG capsule TAKE 1 CAPSULE BY MOUTH EVERY DAY 90 capsule 0   hydrOXYzine (ATARAX) 50 MG tablet Take 0.5-1 tablets (25-50 mg total) by mouth 3 (three) times daily as needed for anxiety. 30 tablet 3   omeprazole (PRILOSEC) 20 MG capsule Take 1 capsule (20 mg total) by mouth daily. 30 capsule 11   predniSONE (DELTASONE) 50 MG tablet Take 1 tablet (50 mg total) by mouth daily with breakfast. 5 tablet 0   sertraline (ZOLOFT) 50 MG tablet Take 1 tablet (50 mg total) by mouth daily. 90 tablet 3   No current facility-administered medications for this visit.    Allergies  Allergen Reactions   Penicillins Anaphylaxis     Past Medical History:  Diagnosis Date   DDD (degenerative disc disease), lumbar    GERD (gastroesophageal reflux disease)     Past Surgical History:  Procedure Laterality Date   ARTHROSCOPIC REPAIR ACL     CARPAL TUNNEL RELEASE Bilateral 2019,2020   CESAREAN SECTION     DILATION AND CURETTAGE OF UTERUS     TRACHEOSTOMY     uterine ablation      Social History   Socioeconomic History   Marital status: Legally Separated    Spouse name: Not on file   Number of children: Not on file   Years of education: Not  on file   Highest education level: Not on file  Occupational History   Not on file  Tobacco Use   Smoking status: Former    Current packs/day: 0.00    Types: Cigarettes    Quit date: 2008    Years since quitting: 17.0   Smokeless tobacco: Never  Vaping Use   Vaping status: Never Used  Substance and Sexual Activity   Alcohol use: Yes    Alcohol/week: 2.0 - 3.0 standard drinks of alcohol    Types: 2 - 3 Cans of beer per week   Drug use: Never   Sexual activity: Not Currently    Birth control/protection: None  Other Topics Concern   Not on file  Social History Narrative   Not on file   Social Drivers of Health   Financial Resource Strain: Not on file  Food Insecurity: Not on file  Transportation Needs: Not on file  Physical Activity: Not on file  Stress: Not on file  Social Connections: Not on file  Intimate Partner Violence: Not on file    Family History  Problem Relation Age of Onset   COPD Mother    Hypertension Father     ROS: no fevers or chills, productive cough, hemoptysis, dysphasia, odynophagia, melena, hematochezia, dysuria, hematuria, rash, seizure activity, orthopnea, PND, pedal edema, claudication. Remaining systems are negative.  Physical Exam:   There were no vitals taken for this  visit.  General:  Well developed/well nourished in NAD Skin warm/dry Patient not depressed No peripheral clubbing Back-normal HEENT-normal/normal eyelids Neck supple/normal carotid upstroke bilaterally; no bruits; no JVD; no thyromegaly chest - CTA/ normal expansion CV - RRR/normal S1 and S2; no murmurs, rubs or gallops;  PMI nondisplaced Abdomen -NT/ND, no HSM, no mass, + bowel sounds, no bruit 2+ femoral pulses, no bruits Ext-no edema, chords, 2+ DP Neuro-grossly nonfocal  ECG -March 19, 2023-normal sinus rhythm with no ST changes personally reviewed  A/P  1 palpitations-  2 hypertension-  Olga Millers, MD

## 2023-08-16 ENCOUNTER — Ambulatory Visit: Payer: BLUE CROSS/BLUE SHIELD | Admitting: Cardiology

## 2023-10-18 ENCOUNTER — Other Ambulatory Visit: Payer: Self-pay | Admitting: Medical-Surgical

## 2023-10-18 DIAGNOSIS — I10 Essential (primary) hypertension: Secondary | ICD-10-CM

## 2023-10-29 ENCOUNTER — Ambulatory Visit: Payer: BLUE CROSS/BLUE SHIELD | Admitting: Medical-Surgical

## 2023-11-16 ENCOUNTER — Ambulatory Visit: Admitting: Medical-Surgical

## 2023-11-16 ENCOUNTER — Encounter: Payer: Self-pay | Admitting: Medical-Surgical

## 2023-11-16 VITALS — BP 143/88 | HR 78 | Resp 20 | Ht 68.0 in | Wt 184.0 lb

## 2023-11-16 DIAGNOSIS — R7303 Prediabetes: Secondary | ICD-10-CM | POA: Insufficient documentation

## 2023-11-16 DIAGNOSIS — K2100 Gastro-esophageal reflux disease with esophagitis, without bleeding: Secondary | ICD-10-CM | POA: Diagnosis not present

## 2023-11-16 DIAGNOSIS — F419 Anxiety disorder, unspecified: Secondary | ICD-10-CM | POA: Diagnosis not present

## 2023-11-16 DIAGNOSIS — I10 Essential (primary) hypertension: Secondary | ICD-10-CM

## 2023-11-16 MED ORDER — HYDROXYZINE HCL 50 MG PO TABS: 25.0000 mg | ORAL_TABLET | Freq: Three times a day (TID) | ORAL | 3 refills | Status: DC | PRN

## 2023-11-16 MED ORDER — SERTRALINE HCL 50 MG PO TABS: 50.0000 mg | ORAL_TABLET | Freq: Every day | ORAL | 3 refills | Status: DC

## 2023-11-16 MED ORDER — HYDROCHLOROTHIAZIDE 12.5 MG PO CAPS: 12.5000 mg | ORAL_CAPSULE | Freq: Every day | ORAL | 3 refills | Status: DC

## 2023-11-16 NOTE — Progress Notes (Unsigned)
        Established patient visit  History, exam, impression, and plan:  1. Primary hypertension (Primary) Very pleasant 58 year old female presenting today with a history of hypertension currently treated with HCTZ 12.5 mg daily.  Takes the medication as prescribed, tolerates well without SE.  Over the past 6 months, she has worked diligently on dietary and lifestyle changes.  She has cut out processed foods and lost approximately 43 pounds.  She is now doing weight bearing exercises as well as jogging with her dog.  Has noted an improvement in her lower extremity edema.  Following a low-sodium diet. Denies CP, SOB, palpitations, dizziness, headaches, or vision changes.  Blood pressure is slightly elevated today on arrival and again on recheck however she did note that she was very excited for this appointment and has been walking around in the room since arrival.  Recommend ambulatory monitoring over the next couple of weeks with a goal of 130/80 or less.  If consistently higher, would like for her to reach out so we can make some medication adjustments.  For now continue hydrochlorothiazide  12.5 mg daily.  Checking labs as below. - hydrochlorothiazide  (MICROZIDE ) 12.5 MG capsule; Take 1 capsule (12.5 mg total) by mouth daily.  Dispense: 90 capsule; Refill: 3 - CMP14+EGFR - CBC - Lipid panel  2. Anxiety She does have a history of anxiety but notes that since she started exercising her stress has been much better.  She still has some anxiety on occasion but notes that taking Zoloft  50 mg daily and using hydroxyzine  on an as-needed basis seems to keep her mood stable.  Denies SI/HI.  Positive mood, smiling and interactive.  Speech, cognition, and affect appropriate.  Continue Zoloft  and hydroxyzine  as prescribed. - hydrOXYzine  (ATARAX ) 50 MG tablet; Take 0.5-1 tablets (25-50 mg total) by mouth 3 (three) times daily as needed for anxiety.  Dispense: 30 tablet; Refill: 3 - sertraline  (ZOLOFT ) 50 MG  tablet; Take 1 tablet (50 mg total) by mouth daily.  Dispense: 90 tablet; Refill: 3  3. Gastroesophageal reflux disease with esophagitis without hemorrhage Since losing his weight as noted above, she reports that her stomach issues have been well-controlled without medication.  Previously used Pepcid  but has not had to use this in quite a while.  Continue dietary and lifestyle modifications.  4. Prediabetes Admits that being told she was prediabetic 6 months ago pushed her to make the lifestyle changes noted above.  She made multiple changes in her diet and has been exercising.  Avoids concentrated sweets and simple carbohydrates whenever possible.  Prior hemoglobin A1c was 5.7%.  Recheck today at 5.3%.  Recommend continuing to work on dietary modifications and regular intentional exercise. - POCT HgB A1C  Procedures performed this visit: None.  Return in about 6 months (around 05/18/2024) for mood/HTN follow up.  __________________________________ Katrina Snook, DNP, APRN, FNP-BC Primary Care and Sports Medicine [facility] [place]

## 2023-11-17 ENCOUNTER — Encounter: Payer: Self-pay | Admitting: Medical-Surgical

## 2023-11-17 LAB — CMP14+EGFR
ALT: 18 IU/L (ref 0–32)
AST: 23 IU/L (ref 0–40)
Albumin: 4.4 g/dL (ref 3.8–4.9)
Alkaline Phosphatase: 78 IU/L (ref 44–121)
BUN/Creatinine Ratio: 12 (ref 9–23)
BUN: 9 mg/dL (ref 6–24)
Bilirubin Total: 0.4 mg/dL (ref 0.0–1.2)
CO2: 24 mmol/L (ref 20–29)
Calcium: 9.5 mg/dL (ref 8.7–10.2)
Chloride: 101 mmol/L (ref 96–106)
Creatinine, Ser: 0.73 mg/dL (ref 0.57–1.00)
Globulin, Total: 2.4 g/dL (ref 1.5–4.5)
Glucose: 99 mg/dL (ref 70–99)
Potassium: 3.8 mmol/L (ref 3.5–5.2)
Sodium: 139 mmol/L (ref 134–144)
Total Protein: 6.8 g/dL (ref 6.0–8.5)
eGFR: 96 mL/min/{1.73_m2} (ref >59)

## 2023-11-17 LAB — POCT GLYCOSYLATED HEMOGLOBIN (HGB A1C): Hemoglobin A1C: 5.3 % (ref 4.0–5.6)

## 2023-11-17 LAB — LIPID PANEL
Chol/HDL Ratio: 2.8 ratio (ref 0.0–4.4)
Cholesterol, Total: 241 mg/dL — ABNORMAL HIGH (ref 100–199)
HDL: 85 mg/dL (ref >39)
LDL Chol Calc (NIH): 147 mg/dL — ABNORMAL HIGH (ref 0–99)
Triglycerides: 57 mg/dL (ref 0–149)
VLDL Cholesterol Cal: 9 mg/dL (ref 5–40)

## 2023-11-17 LAB — CBC
Hematocrit: 42.2 % (ref 34.0–46.6)
Hemoglobin: 14.3 g/dL (ref 11.1–15.9)
MCH: 31.3 pg (ref 26.6–33.0)
MCHC: 33.9 g/dL (ref 31.5–35.7)
MCV: 92 fL (ref 79–97)
Platelets: 292 10*3/uL (ref 150–450)
RBC: 4.57 x10E6/uL (ref 3.77–5.28)
RDW: 12.4 % (ref 11.7–15.4)
WBC: 7 10*3/uL (ref 3.4–10.8)

## 2023-11-25 ENCOUNTER — Ambulatory Visit
Admission: EM | Admit: 2023-11-25 | Discharge: 2023-11-25 | Disposition: A | Discharge status: Home / Self Care | Attending: Family Medicine | Admitting: Family Medicine

## 2023-11-25 ENCOUNTER — Other Ambulatory Visit: Payer: Self-pay

## 2023-11-25 DIAGNOSIS — J039 Acute tonsillitis, unspecified: Secondary | ICD-10-CM

## 2023-11-25 LAB — POCT RAPID STREP A (OFFICE): Rapid Strep A Screen: NEGATIVE

## 2023-11-25 MED ORDER — PREDNISONE 50 MG PO TABS: ORAL_TABLET | ORAL | 0 refills | Status: DC

## 2023-11-25 MED ORDER — CLARITHROMYCIN 500 MG PO TABS: 500.0000 mg | ORAL_TABLET | Freq: Two times a day (BID) | ORAL | 0 refills | Status: DC

## 2023-11-25 MED ORDER — HYDROCODONE-ACETAMINOPHEN 7.5-325 MG PO TABS
1.0000 | ORAL_TABLET | Freq: Four times a day (QID) | ORAL | 0 refills | Status: DC | PRN
Start: 2023-11-25 — End: 2023-12-09

## 2023-11-25 NOTE — Discharge Instructions (Signed)
 Take Tylenol  or ibuprofen for moderate pain Take hydrocodone  as needed for severe pain Do not drive on hydrocodone  May use salt water gargles or sore throats. Take antibiotic 2 times a day. Take prednisone  once a day Expect improvement over the next couple of days The throat swab has been sent for culture.  You will be called if any change in therapy is needed

## 2023-11-25 NOTE — ED Triage Notes (Addendum)
 X 5 days initially started with having sore throat which has continued, now neck and mouth hurts. Has not checked for fever. Pt has hot potato voice upon interview. Has had tylenol .

## 2023-11-25 NOTE — ED Provider Notes (Signed)
 [facility]    CSN: [id number] Arrival date & time: 11/25/23  7829      History   Chief Complaint Chief Complaint  Patient presents with   Sore Throat    HPI Katrina Cole is a 58 y.o. female.   Patient is here for a painful sore throat and issues progressively worsening over the last 5 days.  She has pain with swallowing.  Muffled voice.  Also some headache and congestion.  No coughing.  No fever or bodyaches.  No known exposure to illness. Chart indicates an anaphylactic reaction to penicillin Patient has successfully taking azithromycin  in the past    Past Medical History:  Diagnosis Date   DDD (degenerative disc disease), lumbar    GERD (gastroesophageal reflux disease)     Patient Active Problem List   Diagnosis Date Noted   Prediabetes 11/16/2023   Primary hypertension 09/08/2022   Closed avulsion fracture of distal fibula, right, initial encounter 07/10/2022   Osteopenia 07/08/2022   Fracture, foot, left, closed, initial encounter 07/02/2022   Abnormal mammogram of right breast 06/08/2022   Gastroesophageal reflux disease with esophagitis without hemorrhage 06/08/2022   Chronic pain syndrome 06/08/2022   Post-menopausal 06/08/2022   Multiple fractures 06/08/2022   Fracture of one rib, right side, initial encounter for closed fracture 04/13/2022   Obesity 06/10/2021   Radiculitis of left cervical region 04/25/2021   Chronic pain of left knee 12/16/2020   Anxiety 07/31/2020   Insomnia 07/31/2020   Tibialis posterior tendinitis, left 05/03/2020   Posterior vaginal wall prolapse 01/22/2020   Lumbar degenerative disc disease 01/17/2020   Chronic right foot and ankle pain 12/27/2019   Fracture of distal phalanx of right middle finger 11/01/2019    Past Surgical History:  Procedure Laterality Date   ARTHROSCOPIC REPAIR ACL     CARPAL TUNNEL RELEASE Bilateral 2019,2020   CESAREAN SECTION     DILATION AND CURETTAGE OF UTERUS     TRACHEOSTOMY      uterine ablation      OB History     Gravida  5   Para  4   Term      Preterm      AB      Living  4      SAB      IAB      Ectopic      Multiple      Live Births  4            Home Medications    Prior to Admission medications   Medication Sig Start Date End Date Taking? Authorizing Provider  clarithromycin (BIAXIN) 500 MG tablet Take 1 tablet (500 mg total) by mouth 2 (two) times daily. 11/25/23  Yes Stephany Ehrich, MD  HYDROcodone -acetaminophen  (NORCO) 7.5-325 MG tablet Take 1 tablet by mouth every 6 (six) hours as needed for moderate pain (pain score 4-6). 11/25/23  Yes Stephany Ehrich, MD  predniSONE  (DELTASONE ) 50 MG tablet Take once a day for 5 days.  Take with food 11/25/23  Yes Stephany Ehrich, MD  hydrochlorothiazide  (MICROZIDE ) 12.5 MG capsule Take 1 capsule (12.5 mg total) by mouth daily. 11/16/23   Cherre Cornish, NP  hydrOXYzine  (ATARAX ) 50 MG tablet Take 0.5-1 tablets (25-50 mg total) by mouth 3 (three) times daily as needed for anxiety. 11/16/23   Cherre Cornish, NP  sertraline  (ZOLOFT ) 50 MG tablet Take 1 tablet (50 mg total) by mouth daily. 11/16/23   Cherre Cornish, NP  Family History Family History  Problem Relation Age of Onset   COPD Mother    Hypertension Father     Social History Social History   Tobacco Use   Smoking status: Former    Current packs/day: 0.00    Types: Cigarettes    Quit date: 2008    Years since quitting: 17.3   Smokeless tobacco: Never  Vaping Use   Vaping status: Never Used  Substance Use Topics   Alcohol use: Yes    Alcohol/week: 2.0 - 3.0 standard drinks of alcohol    Types: 2 - 3 Cans of beer per week   Drug use: Never     Allergies   Penicillins   Review of Systems Review of Systems  See HPI Physical Exam Triage Vital Signs ED Triage Vitals  Encounter Vitals Group     BP 11/25/23 0842 (!) 153/100     Systolic BP Percentile --      Diastolic BP Percentile --      Pulse Rate 11/25/23  0842 80     Resp 11/25/23 0842 16     Temp 11/25/23 0842 98.8 F (37.1 C)     Temp src --      SpO2 11/25/23 0842 97 %     Weight --      Height --      Head Circumference --      Peak Flow --      Pain Score 11/25/23 0846 7     Pain Loc --      Pain Education --      Exclude from Growth Chart --    No data found.  Updated Vital Signs BP (!) 153/100   Pulse 80   Temp 98.8 F (37.1 C)   Resp 16   LMP  (LMP Unknown)   SpO2 97%      Physical Exam Constitutional:      General: She is not in acute distress.    Appearance: She is well-developed and normal weight. She is ill-appearing.     Comments: Muffled voice.  Appears ill  HENT:     Head: Normocephalic and atraumatic.     Right Ear: Tympanic membrane normal.     Left Ear: Tympanic membrane normal.     Nose: Congestion and rhinorrhea present.     Mouth/Throat:     Mouth: Mucous membranes are moist.     Pharynx: Uvula midline. Pharyngeal swelling, posterior oropharyngeal erythema and uvula swelling present.     Tonsils: No tonsillar exudate. 3+ on the right. 2+ on the left.     Comments: Right tonsil is larger than the left.  There is a tender node at the right angle of the jaw.  The tonsil swelling does not extend up to the soft palate or uvula.  At this time cannot diagnose abscess although this is discussed with the patient Eyes:     Conjunctiva/sclera: Conjunctivae normal.     Pupils: Pupils are equal, round, and reactive to light.  Cardiovascular:     Rate and Rhythm: Normal rate.  Pulmonary:     Effort: Pulmonary effort is normal. No respiratory distress.  Musculoskeletal:        General: Normal range of motion.     Cervical back: Normal range of motion.  Lymphadenopathy:     Cervical: Cervical adenopathy present.  Skin:    General: Skin is warm and dry.  Neurological:     Mental Status: She is alert.  UC Treatments / Results  Labs (all labs ordered are listed, but only abnormal results are  displayed) Labs Reviewed  CULTURE, GROUP A STREP [facility])  POCT RAPID STREP A (OFFICE)    EKG   Radiology No results found.  Procedures Procedures (including critical care time)  Medications Ordered in UC Medications - No data to display  Initial Impression / Assessment and Plan / UC Course  I have reviewed the triage vital signs and the nursing notes.  Pertinent labs & imaging results that were available during my care of the patient were reviewed by me and considered in my medical decision making (see chart for details).     Patient has an asymmetric tonsillitis right greater than left with right-sided adenopathy.  This may be viral but because of the severity of her sore throat and that it is lasted for 5 days I think an antibiotic is indicated.  Culture is sent to the laboratory.  Pain management reviewed. Final Clinical Impressions(s) / UC Diagnoses   Final diagnoses:  Tonsillitis   Discharge Instructions      Take Tylenol  or ibuprofen for moderate pain Take hydrocodone  as needed for severe pain Do not drive on hydrocodone  May use salt water gargles or sore throats. Take antibiotic 2 times a day. Take prednisone  once a day Expect improvement over the next couple of days The throat swab has been sent for culture.  You will be called if any change in therapy is needed  ED Prescriptions     Medication Sig Dispense Auth. Provider   clarithromycin (BIAXIN) 500 MG tablet Take 1 tablet (500 mg total) by mouth 2 (two) times daily. 20 tablet Stephany Ehrich, MD   predniSONE  (DELTASONE ) 50 MG tablet Take once a day for 5 days.  Take with food 5 tablet Stephany Ehrich, MD   HYDROcodone -acetaminophen  (NORCO) 7.5-325 MG tablet Take 1 tablet by mouth every 6 (six) hours as needed for moderate pain (pain score 4-6). 10 tablet Stephany Ehrich, MD      I have reviewed the PDMP during this encounter.   Stephany Ehrich, MD 11/25/23 1027

## 2023-11-27 LAB — CULTURE, GROUP A STREP (THRC)

## 2023-12-09 ENCOUNTER — Ambulatory Visit
Admission: EM | Admit: 2023-12-09 | Discharge: 2023-12-09 | Disposition: A | Discharge status: Home / Self Care | Attending: Family Medicine | Admitting: Family Medicine

## 2023-12-09 ENCOUNTER — Ambulatory Visit

## 2023-12-09 ENCOUNTER — Other Ambulatory Visit: Payer: Self-pay

## 2023-12-09 DIAGNOSIS — R0789 Other chest pain: Secondary | ICD-10-CM | POA: Diagnosis not present

## 2023-12-09 DIAGNOSIS — R0781 Pleurodynia: Secondary | ICD-10-CM | POA: Diagnosis not present

## 2023-12-09 MED ORDER — KETOROLAC TROMETHAMINE 60 MG/2ML IM SOLN
30.0000 mg | Freq: Once | INTRAMUSCULAR | Status: AC
  Administered 2023-12-09: 30 mg via INTRAMUSCULAR

## 2023-12-09 MED ORDER — HYDROCODONE-ACETAMINOPHEN 7.5-325 MG PO TABS: 1.0000 | ORAL_TABLET | Freq: Four times a day (QID) | ORAL | 0 refills | Status: DC | PRN

## 2023-12-09 NOTE — ED Triage Notes (Addendum)
 Walking and carrying box and ran into counter yesterday morning, concerned she broke a rib on the left side. Hurts to move left shoulder or sit up straight. Has had tylenol .

## 2023-12-09 NOTE — ED Provider Notes (Signed)
 [facility]    CSN: [id number] Arrival date & time: 12/09/23  0859      History   Chief Complaint Chief Complaint  Patient presents with   Rib Injury    HPI Katrina Cole is a 58 y.o. female.   Patient states that she was carrying a box in front of her and it was awkward, she ran into a shelf and the box forcibly hit her in the chest.  This happened yesterday.  She now has pain under her right shoulder blade and feels like she may have a rib injury.  It hurts with deep breath cough or sneeze.  She does have a history of osteopenia    Past Medical History:  Diagnosis Date   DDD (degenerative disc disease), lumbar    GERD (gastroesophageal reflux disease)     Patient Active Problem List   Diagnosis Date Noted   Prediabetes 11/16/2023   Primary hypertension 09/08/2022   Closed avulsion fracture of distal fibula, right, initial encounter 07/10/2022   Osteopenia 07/08/2022   Fracture, foot, left, closed, initial encounter 07/02/2022   Abnormal mammogram of right breast 06/08/2022   Gastroesophageal reflux disease with esophagitis without hemorrhage 06/08/2022   Chronic pain syndrome 06/08/2022   Post-menopausal 06/08/2022   Multiple fractures 06/08/2022   Fracture of one rib, right side, initial encounter for closed fracture 04/13/2022   Obesity 06/10/2021   Radiculitis of left cervical region 04/25/2021   Chronic pain of left knee 12/16/2020   Anxiety 07/31/2020   Insomnia 07/31/2020   Tibialis posterior tendinitis, left 05/03/2020   Posterior vaginal wall prolapse 01/22/2020   Lumbar degenerative disc disease 01/17/2020   Chronic right foot and ankle pain 12/27/2019   Fracture of distal phalanx of right middle finger 11/01/2019    Past Surgical History:  Procedure Laterality Date   ARTHROSCOPIC REPAIR ACL     CARPAL TUNNEL RELEASE Bilateral 2019,2020   CESAREAN SECTION     DILATION AND CURETTAGE OF UTERUS     TRACHEOSTOMY     uterine ablation       OB History     Gravida  5   Para  4   Term      Preterm      AB      Living  4      SAB      IAB      Ectopic      Multiple      Live Births  4            Home Medications    Prior to Admission medications   Medication Sig Start Date End Date Taking? Authorizing Provider  hydrochlorothiazide  (MICROZIDE ) 12.5 MG capsule Take 1 capsule (12.5 mg total) by mouth daily. 11/16/23   Cherre Cornish, NP  HYDROcodone -acetaminophen  (NORCO) 7.5-325 MG tablet Take 1 tablet by mouth every 6 (six) hours as needed for moderate pain (pain score 4-6). 12/09/23   Stephany Ehrich, MD  hydrOXYzine  (ATARAX ) 50 MG tablet Take 0.5-1 tablets (25-50 mg total) by mouth 3 (three) times daily as needed for anxiety. 11/16/23   Cherre Cornish, NP  sertraline  (ZOLOFT ) 50 MG tablet Take 1 tablet (50 mg total) by mouth daily. 11/16/23   Cherre Cornish, NP    Family History Family History  Problem Relation Age of Onset   COPD Mother    Hypertension Father     Social History Social History   Tobacco Use   Smoking status: Former  Current packs/day: 0.00    Types: Cigarettes    Quit date: 2008    Years since quitting: 17.4   Smokeless tobacco: Never  Vaping Use   Vaping status: Never Used  Substance Use Topics   Alcohol use: Yes    Alcohol/week: 2.0 - 3.0 standard drinks of alcohol    Types: 2 - 3 Cans of beer per week   Drug use: Never     Allergies   Penicillins   Review of Systems Review of Systems  See HPI Physical Exam Triage Vital Signs ED Triage Vitals  Encounter Vitals Group     BP 12/09/23 0913 (!) 142/94     Systolic BP Percentile --      Diastolic BP Percentile --      Pulse Rate 12/09/23 0913 81     Resp 12/09/23 0913 16     Temp 12/09/23 0913 98.9 F (37.2 C)     Temp src --      SpO2 12/09/23 0913 95 %     Weight --      Height --      Head Circumference --      Peak Flow --      Pain Score 12/09/23 0916 8     Pain Loc --      Pain Education --       Exclude from Growth Chart --    No data found.  Updated Vital Signs BP (!) 142/94   Pulse 81   Temp 98.9 F (37.2 C)   Resp 16   LMP  (LMP Unknown)   SpO2 95%   Physical Exam Constitutional:      General: She is not in acute distress.    Appearance: She is well-developed.     Comments: In obvious discomfort  HENT:     Head: Normocephalic and atraumatic.  Eyes:     Conjunctiva/sclera: Conjunctivae normal.     Pupils: Pupils are equal, round, and reactive to light.  Cardiovascular:     Rate and Rhythm: Normal rate.  Pulmonary:     Effort: Pulmonary effort is normal. No respiratory distress.  Musculoskeletal:        General: Normal range of motion.     Cervical back: Normal range of motion.       Back:  Skin:    General: Skin is warm and dry.  Neurological:     Mental Status: She is alert.      UC Treatments / Results  Labs (all labs ordered are listed, but only abnormal results are displayed) Labs Reviewed - No data to display  EKG   Radiology DG Ribs Unilateral W/Chest Left Result Date: 12/09/2023 CLINICAL DATA:  Left chest wall pain. EXAM: LEFT RIBS AND CHEST - 3+ VIEW COMPARISON:  Chest radiograph dated 04/10/2022. FINDINGS: No fracture or other bone lesions are seen involving the ribs. There is no evidence of pneumothorax or pleural effusion. Both lungs are clear. Heart size and mediastinal contours are within normal limits. IMPRESSION: Negative. Electronically Signed   By: Angus Bark M.D.   On: 12/09/2023 11:06    Procedures Procedures (including critical care time)  Medications Ordered in UC Medications  ketorolac  (TORADOL ) injection 30 mg (30 mg Intramuscular Given 12/09/23 1024)    Initial Impression / Assessment and Plan / UC Course  I have reviewed the triage vital signs and the nursing notes.  Pertinent labs & imaging results that were available during my care of the patient were  reviewed by me and considered in my medical decision making  (see chart for details).     Patient was informed that her x-rays were negative for fracture.  Conservative management reviewed Final Clinical Impressions(s) / UC Diagnoses   Final diagnoses:  Rib pain     Discharge Instructions      Take Tylenol , ibuprofen or naproxen for moderate pain Take hydrocodone  if pain severe Do not drive on hydrocodone  Limit activity while back is painful See your doctor if not improving by next week  ED Prescriptions     Medication Sig Dispense Auth. Provider   HYDROcodone -acetaminophen  (NORCO) 7.5-325 MG tablet Take 1 tablet by mouth every 6 (six) hours as needed for moderate pain (pain score 4-6). 10 tablet Stephany Ehrich, MD      I have reviewed the PDMP during this encounter.   Stephany Ehrich, MD 12/09/23 [phone]

## 2023-12-09 NOTE — Discharge Instructions (Signed)
 Take Tylenol , ibuprofen or naproxen for moderate pain Take hydrocodone  if pain severe Do not drive on hydrocodone  Limit activity while back is painful See your doctor if not improving by next week

## 2024-03-03 ENCOUNTER — Ambulatory Visit: Payer: Self-pay

## 2024-03-03 NOTE — Telephone Encounter (Signed)
 FYI Only or Action Required?: FYI only for provider.  Patient was last seen in primary care on 11/16/2023 by Willo Mini, NP.  Called Nurse Triage reporting Sore Throat, Facial Pain, and Headache.  Symptoms began several days ago.  Interventions attempted: OTC medications: Nyquil and Rest, hydration, or home remedies.  Symptoms are: gradually worsening.  Triage Disposition: See Physician Within 24 Hours  Patient/caregiver understands and will follow disposition?:    Copied from CRM #[id number]. Topic: Clinical - Red Word Triage >> Mar 03, 2024  2:40 PM Graeme ORN wrote: Red Word that prompted transfer to Nurse Triage: Throat feels like its on fire, can't swallow, eyes and face hurt. Reason for Disposition  SEVERE throat pain (e.g., excruciating)  Answer Assessment - Initial Assessment Questions 1. ONSET: When did the throat start hurting? (Hours or days ago)       Four days ago  2. SEVERITY: How bad is the sore throat? (Scale 1-10; mild, moderate or severe)     Severe burning 3. STREP EXPOSURE: Has there been any exposure to strep within the past week? If Yes, ask: What type of contact occurred?       4.  VIRAL SYMPTOMS: Are there any symptoms of a cold, such as a runny nose, cough, hoarse voice or red eyes?      Ears painful 5. FEVER: Do you have a fever? If Yes, ask: What is your temperature, how was it measured, and when did it start?     Yes 6. PUS ON THE TONSILS: Is there pus on the tonsils in the back of your throat?     Bright red throat 7. OTHER SYMPTOMS: Do you have any other symptoms? (e.g., difficulty breathing, headache, rash)     Eye and face pain  No appointments are available, proceeding to urgent care  Protocols used: Sore Throat-A-AH

## 2024-03-14 ENCOUNTER — Encounter: Payer: Self-pay | Admitting: Sports Medicine

## 2024-05-22 ENCOUNTER — Ambulatory Visit

## 2024-05-22 ENCOUNTER — Ambulatory Visit: Admitting: Medical-Surgical

## 2024-05-22 ENCOUNTER — Encounter: Payer: Self-pay | Admitting: Medical-Surgical

## 2024-05-22 ENCOUNTER — Ambulatory Visit: Payer: Self-pay

## 2024-05-22 VITALS — BP 123/77 | HR 71 | Resp 20 | Ht 68.0 in | Wt 202.1 lb

## 2024-05-22 DIAGNOSIS — I10 Essential (primary) hypertension: Secondary | ICD-10-CM | POA: Diagnosis not present

## 2024-05-22 DIAGNOSIS — M25562 Pain in left knee: Secondary | ICD-10-CM | POA: Diagnosis not present

## 2024-05-22 MED ORDER — MELOXICAM 15 MG PO TABS: 15.0000 mg | ORAL_TABLET | Freq: Every day | ORAL | 0 refills | Status: DC

## 2024-05-22 MED ORDER — CYCLOBENZAPRINE HCL 10 MG PO TABS: 5.0000 mg | ORAL_TABLET | Freq: Three times a day (TID) | ORAL | 0 refills | Status: DC | PRN

## 2024-05-22 MED ORDER — HYDROCHLOROTHIAZIDE 25 MG PO TABS: 12.5000 mg | ORAL_TABLET | Freq: Every day | ORAL | 1 refills | Status: DC

## 2024-05-22 MED ORDER — METHYLPREDNISOLONE 4 MG PO TBPK: ORAL_TABLET | ORAL | 0 refills | Status: DC

## 2024-05-22 NOTE — Telephone Encounter (Signed)
  FYI Only or Action Required?: Action required by provider: request for appointment.  Patient was last seen in primary care on 11/16/2023 by Willo Mini, NP.  Called Nurse Triage reporting Knee Pain.  Symptoms began about a month ago.  Interventions attempted: Nothing.  Symptoms are: gradually worsening.  Triage Disposition: See HCP Within 4 Hours (Or PCP Triage)  Patient/caregiver understands and will follow disposition?: Yes     Copied from CRM [id number]. Topic: Clinical - Red Word Triage >> May 22, 2024  7:52 AM Tobias CROME wrote: Red Word that prompted transfer to Nurse Triage: a lot of pain in left knee, injured it somehow, trouble walking and standing Reason for Disposition  [1] SEVERE pain (e.g., excruciating, unable to walk) AND [2] not improved after 2 hours of pain medicine  Answer Assessment - Initial Assessment Questions 1. LOCATION and RADIATION: Where is the pain located?      Left knee 2. QUALITY: What does the pain feel like?  (e.g., sharp, dull, aching, burning)     ache 3. SEVERITY: How bad is the pain? What does it keep you from doing?   (Scale 1-10; or mild, moderate, severe)     severe 4. ONSET: When did the pain start? Does it come and go, or is it there all the time?     1 month 5. RECURRENT: Have you had this pain before? If Yes, ask: When, and what happened then?     no 6. SETTING: Has there been any recent work, exercise or other activity that involved that part of the body?      no 7. AGGRAVATING FACTORS: What makes the knee pain worse? (e.g., walking, climbing stairs, running)     walking 8. ASSOCIATED SYMPTOMS: Is there any swelling or redness of the knee?     no 9. OTHER SYMPTOMS: Do you have any other symptoms? (e.g., calf pain, chest pain, difficulty breathing, fever)     no 10. PREGNANCY: Is there any chance you are pregnant? When was your last menstrual period?       no  Protocols used: Knee Pain-A-AH

## 2024-05-22 NOTE — Progress Notes (Signed)
 Established patient visit   History of Present Illness   Discussed the use of AI scribe software for clinical note transcription with the patient, who gave verbal consent to proceed.  History of Present Illness   Katrina Cole is a 58 year old female with a history of ACL surgery who presents with persistent lateral knee pain for over a month.  Lateral left knee pain - Persistent sharp, stabbing pain localized to the lateral aspect of the knee for over one month - Pain radiates several inches up and down the leg - Pain is constant and worsening - Exacerbated by knee bending and heel tapping - Momentarily relieved by walking with the leg straight - Pain severity interferes with ability to work; has been sent home due to pain - Pain intensity occasionally causes her to scream - Concerned about missing work due to pain  Associated symptoms and functional impact - Swelling sensation present on the lateral side of the upper knee - Popping and clicking sounds in the knee - No locking of the knee - Pain disrupts sleep, requiring Tylenol  PM at night  Prior treatments and response - Rest, ice, and Tylenol  have not provided pain relief  Surgical history - Anterior cruciate ligament (ACL) repair surgery on the same knee 25 years ago     Physical Exam   Physical Exam Vitals reviewed.  Constitutional:      General: She is not in acute distress.    Appearance: Normal appearance.  HENT:     Head: Normocephalic and atraumatic.  Cardiovascular:     Rate and Rhythm: Normal rate and regular rhythm.     Pulses: Normal pulses.     Heart sounds: Normal heart sounds. No murmur heard.    No friction rub. No gallop.  Pulmonary:     Effort: Pulmonary effort is normal. No respiratory distress.     Breath sounds: Normal breath sounds. No wheezing.  Musculoskeletal:     Right knee: Crepitus present.     Left knee: Crepitus present. Decreased range of motion. Tenderness present over  the lateral joint line.     Instability Tests: Lateral McMurray test positive.  Skin:    General: Skin is warm and dry.  Neurological:     Mental Status: She is alert and oriented to person, place, and time.  Psychiatric:        Mood and Affect: Mood normal.        Behavior: Behavior normal.        Thought Content: Thought content normal.        Judgment: Judgment normal.    Assessment & Plan     Left knee pain with suspected lateral meniscal injury and iliotibial band syndrome Pain likely due to lateral meniscal injury and IT band syndrome. Imaging needed for confirmation, but conservative management required first. - Ordered x-ray of left knee to rule out bony abnormalities. - Prescribed prednisone  Dosepak for inflammation. - Post-steroid completion start meloxicam  15 mg daily for at least two weeks, then daily as needed. - Prescribed Flexeril  5-10 mg three times daily as needed for muscle relaxation, especially at night. - Provided knee brace for support during activities. - Advised on home exercises to strengthen and support the knee. - Instructed to avoid twisting movements and use proper foot mechanics to prevent further injury. - Discussed potential for formal physical therapy if home exercises are not effective.  - No official work related restrictions at this  time however, she should go by her pain tolerance and limit activities that cause exacerbation.   Primary HTN Has been taking hydrochlorothiazide  12.5mg  daily, not checking BP at home. BP elevated here on the last several readings. Recheck at goal today.  - Increase hydrochlorothiazide  to 25mg  daily to help with BP control and lower extremity edema. - Return in 2 weeks for NV for BP check.  Follow up   Return in about 2 weeks (around 06/05/2024) for NV for BP check; 6 weeks if no improvement in knee pain. __________________________________ Zada FREDRIK Palin, DNP, APRN, FNP-BC Primary Care and Sports Medicine [facility] [place]

## 2024-05-23 ENCOUNTER — Telehealth: Payer: Self-pay

## 2024-05-23 ENCOUNTER — Ambulatory Visit: Admission: EM | Admit: 2024-05-23 | Discharge: 2024-05-23 | Disposition: A | Discharge status: Home / Self Care

## 2024-05-23 ENCOUNTER — Encounter: Payer: Self-pay | Admitting: Emergency Medicine

## 2024-05-23 DIAGNOSIS — S90852A Superficial foreign body, left foot, initial encounter: Secondary | ICD-10-CM

## 2024-05-23 NOTE — ED Provider Notes (Signed)
 [facility]    CSN: [id number] Arrival date & time: 05/23/24  9182      History   Chief Complaint Chief Complaint  Patient presents with   Foot Pain    HPI Katrina Cole is a 58 y.o. female.   HPI 58 year old female presents with left foot pain and believes a piece of glass is stuck and it from Sunday, 05/21/2024. PMH significant for obesity, anxiety, and osteopenia.  Past Medical History:  Diagnosis Date   DDD (degenerative disc disease), lumbar    GERD (gastroesophageal reflux disease)     Patient Active Problem List   Diagnosis Date Noted   Prediabetes 11/16/2023   Primary hypertension 09/08/2022   Closed avulsion fracture of distal fibula, right, initial encounter 07/10/2022   Osteopenia 07/08/2022   Fracture, foot, left, closed, initial encounter 07/02/2022   Abnormal mammogram of right breast 06/08/2022   Gastroesophageal reflux disease with esophagitis without hemorrhage 06/08/2022   Chronic pain syndrome 06/08/2022   Post-menopausal 06/08/2022   Multiple fractures 06/08/2022   Fracture of one rib, right side, initial encounter for closed fracture 04/13/2022   Obesity 06/10/2021   Radiculitis of left cervical region 04/25/2021   Chronic pain of left knee 12/16/2020   Anxiety 07/31/2020   Insomnia 07/31/2020   Tibialis posterior tendinitis, left 05/03/2020   Posterior vaginal wall prolapse 01/22/2020   Lumbar degenerative disc disease 01/17/2020   Chronic right foot and ankle pain 12/27/2019   Fracture of distal phalanx of right middle finger 11/01/2019    Past Surgical History:  Procedure Laterality Date   ARTHROSCOPIC REPAIR ACL     CARPAL TUNNEL RELEASE Bilateral 2019,2020   CESAREAN SECTION     DILATION AND CURETTAGE OF UTERUS     TRACHEOSTOMY     uterine ablation      OB History     Gravida  5   Para  4   Term      Preterm      AB      Living  4      SAB      IAB      Ectopic      Multiple      Live Births   4            Home Medications    Prior to Admission medications   Medication Sig Start Date End Date Taking? Authorizing Provider  cyclobenzaprine  (FLEXERIL ) 10 MG tablet Take 0.5-1 tablets (5-10 mg total) by mouth 3 (three) times daily as needed. 05/22/24  Yes Willo Mini, NP  hydrochlorothiazide  (HYDRODIURIL ) 25 MG tablet Take 0.5 tablets (12.5 mg total) by mouth daily. 05/22/24  Yes Willo Mini, NP  hydrOXYzine  (ATARAX ) 50 MG tablet Take 0.5-1 tablets (25-50 mg total) by mouth 3 (three) times daily as needed for anxiety. 11/16/23  Yes Willo Mini, NP  meloxicam  (MOBIC ) 15 MG tablet Take 1 tablet (15 mg total) by mouth daily. 05/22/24  Yes Jessup, Mini, NP  methylPREDNISolone  (MEDROL  DOSEPAK) 4 MG TBPK tablet Take as directed. 05/22/24  Yes Willo Mini, NP  sertraline  (ZOLOFT ) 50 MG tablet Take 1 tablet (50 mg total) by mouth daily. 11/16/23  Yes Willo Mini, NP    Family History Family History  Problem Relation Age of Onset   COPD Mother    Hypertension Father     Social History Social History   Tobacco Use   Smoking status: Former    Current packs/day: 0.00    Types: Cigarettes  Quit date: 2008    Years since quitting: 17.8   Smokeless tobacco: Never  Vaping Use   Vaping status: Never Used  Substance Use Topics   Alcohol use: Yes    Alcohol/week: 2.0 - 3.0 standard drinks of alcohol    Types: 2 - 3 Cans of beer per week   Drug use: Never     Allergies   Penicillins   Review of Systems Review of Systems   Physical Exam Triage Vital Signs ED Triage Vitals  Encounter Vitals Group     BP      Girls Systolic BP Percentile      Girls Diastolic BP Percentile      Boys Systolic BP Percentile      Boys Diastolic BP Percentile      Pulse      Resp      Temp      Temp src      SpO2      Weight      Height      Head Circumference      Peak Flow      Pain Score      Pain Loc      Pain Education      Exclude from Growth Chart    No data  found.  Updated Vital Signs BP (!) 172/99 (BP Location: Right Arm)   Pulse 77   Temp 98.3 F (36.8 C) (Oral)   Resp 18   Ht 5' 8 (1.727 m)   Wt 200 lb (90.7 kg)   LMP  (LMP Unknown)   SpO2 94%   BMI 30.41 kg/m   Visual Acuity Right Eye Distance:   Left Eye Distance:   Bilateral Distance:    Right Eye Near:   Left Eye Near:    Bilateral Near:     Physical Exam Vitals and nursing note reviewed.  Constitutional:      Appearance: She is obese.  HENT:     Head: Normocephalic and atraumatic.     Mouth/Throat:     Mouth: Mucous membranes are moist.     Pharynx: Oropharynx is clear.  Eyes:     Extraocular Movements: Extraocular movements intact.     Conjunctiva/sclera: Conjunctivae normal.     Pupils: Pupils are equal, round, and reactive to light.  Cardiovascular:     Rate and Rhythm: Normal rate and regular rhythm.     Heart sounds: Normal heart sounds.  Pulmonary:     Effort: Pulmonary effort is normal.     Breath sounds: Normal breath sounds. No wheezing, rhonchi or rales.  Musculoskeletal:        General: Normal range of motion.  Skin:    General: Skin is warm and dry.     Comments: Left foot (sole over fifth metatarsal head): Tiny (2 mm) linear glass shard removed successfully from foot with hemostats and loops, scant bleeding, patient tolerated procedure well  Neurological:     General: No focal deficit present.     Mental Status: She is oriented to person, place, and time. Mental status is at baseline.  Psychiatric:        Mood and Affect: Mood normal.        Behavior: Behavior normal.      UC Treatments / Results  Labs (all labs ordered are listed, but only abnormal results are displayed) Labs Reviewed - No data to display  EKG   Radiology No results found.  Procedures Procedures (including critical  care time)  Medications Ordered in UC Medications - No data to display  Initial Impression / Assessment and Plan / UC Course  I have reviewed  the triage vital signs and the nursing notes.  Pertinent labs & imaging results that were available during my care of the patient were reviewed by me and considered in my medical decision making (see chart for details).     MDM: 1.  Acute foreign body of left foot, initial encounter-tiny 2 mm glass shard removed successfully with hemostats and loupes.  Patient advised of signs and symptoms of potential infection. Advised patient if signs or symptoms of infection from left foot foreign body/puncture wound please follow-up with me for further evaluation.  Patient discharged home, hemodynamically stable.  Final Clinical Impressions(s) / UC Diagnoses   Final diagnoses:  Acute foreign body of left foot, initial encounter     Discharge Instructions      Advised patient if signs or symptoms of infection from left foot foreign body/puncture wound please follow-up with me for further evaluation.     ED Prescriptions   None    PDMP not reviewed this encounter.   Teddy Sharper, FNP 05/23/24 [phone]

## 2024-05-23 NOTE — Telephone Encounter (Signed)
 Attempted return call to patient for clarification of problem with hydrochlorothiazide  prescription. Left a voice mail message requesting a return call.   I think this may be in regards to the hydrochlorothiazide  25mg  was written to take 1/2 tablet but OV notes read to increase to 25mg  .  Should patient be taking full tablet of hydrochlorothiazide ?

## 2024-05-23 NOTE — Telephone Encounter (Signed)
 Copied from CRM [id number]. Topic: Clinical - Medication Question >> May 23, 2024 12:10 PM Edsel HERO wrote: Patient wants to confirm the dose of hydrochlorothiazide  (HYDRODIURIL ) 25 MG tablet she should be taking. The direction on the bottle and what what discussed during her visit on 11/10 are not the same. Please advise.

## 2024-05-23 NOTE — Discharge Instructions (Addendum)
 Advised patient if signs or symptoms of infection from left foot foreign body/puncture wound please follow-up with me for further evaluation.

## 2024-05-23 NOTE — ED Triage Notes (Signed)
 Patient states that she believes she stepped on a piece of glass on Sunday causing pain in left foot.  Patient tried removing it but unable too.  Has taken Tylenol  for pain.  Slight redness.

## 2024-05-23 NOTE — Telephone Encounter (Signed)
 Pt returning missed call from Luke, LPN at [facility] for clarification on how much hydrochlorothiazide  she should be taking. LVM requesting pt to call back. Pt says new bottle states to take 1/2 of a 25 mg tablet. Per providers note from OV yesterday, instructions are to take 25 mg. Called [facility] and transferred pt to nurse Luke. Reports she has not gotten a response back from provider yet but that they will call pt when they do with further instructions. Pt states it is ok to leave detailed VM with med instructions on her cell phone [phone].

## 2024-05-24 ENCOUNTER — Other Ambulatory Visit: Payer: Self-pay

## 2024-05-24 ENCOUNTER — Ambulatory Visit: Payer: Self-pay | Admitting: Medical-Surgical

## 2024-05-24 MED ORDER — HYDROCHLOROTHIAZIDE 25 MG PO TABS: 25.0000 mg | ORAL_TABLET | Freq: Every day | ORAL | 1 refills | Status: AC

## 2024-05-24 NOTE — Progress Notes (Signed)
 Pended script with corrected dosing -o.k. to resend to the pharmacy?

## 2024-05-24 NOTE — Telephone Encounter (Signed)
 Forwarded message to Zada Palin, NP to see if o.k. to send corrected script

## 2024-05-24 NOTE — Progress Notes (Signed)
Correct dosage sent. 

## 2024-05-26 ENCOUNTER — Ambulatory Visit: Payer: Self-pay

## 2024-05-26 DIAGNOSIS — M25562 Pain in left knee: Secondary | ICD-10-CM

## 2024-05-26 NOTE — Telephone Encounter (Signed)
 Patient informed of provider recommendations and that urgent referral was placed today.

## 2024-05-26 NOTE — Telephone Encounter (Signed)
 Urgent referral sent for sports med so hopefully they can get her in early next week at our [facility] location to figure out what is going on if her pain is that bad I agree she probably needs to go to the emergency department if she is in tears otherwise I recommend icing pretty aggressively rest elevation.  If it is more uncomfortable to wear the brace then do not wear it.  She can also take 2 extra strength Tylenol  every 8 hours on top of the prednisone .

## 2024-05-26 NOTE — Addendum Note (Signed)
 Addended by: Damein Gaunce D on: 05/26/2024 04:40 PM   Modules accepted: Orders

## 2024-05-26 NOTE — Telephone Encounter (Signed)
 FYI Only or Action Required?: Action required by provider: referral request.  Patient was last seen in primary care on 05/22/2024 by Willo Mini, NP.  Called Nurse Triage reporting Knee Pain.  Symptoms began about a month ago.  Interventions attempted: Prescription medications: Brace, muscle relaxer.  Symptoms are: gradually worsening.  Triage Disposition: Home Care  Patient/caregiver understands and will follow disposition?: Yes Reason for Disposition  Knee pain  Answer Assessment - Initial Assessment Questions Saw PCP Monday, was given a brace and muscle relaxer and a f/u date in December. Patient crying on phone in so much pain. Patient unsure what to do at this point as she needs to function to go work. Patient requesting a referral to a specialist and I advised ED if pain is excruciating. Patient is wondering if there is anything else to be done for the pain, requesting something sent to [facility] pharmacy on file and requesting callback.  [phone].   1. LOCATION and RADIATION: Where is the pain located?      Behind left knee  2. QUALITY: What does the pain feel like?  (e.g., sharp, dull, aching, burning)     Sharp stabbing pain   3. SEVERITY: How bad is the pain? What does it keep you from doing?   (Scale 1-10; or mild, moderate, severe)     Severe, cannot walk in so much pain. Describes it as a knife shooting into knee when trying to walk  4. ONSET: When did the pain start? Does it come and go, or is it there all the time?     Over a month ago  5. AGGRAVATING FACTORS: What makes the knee pain worse? (e.g., walking, climbing stairs, running)     Constant 8/10 but when walking its off the charts.   6. ASSOCIATED SYMPTOMS: Is there any swelling or redness of the knee?     Swelling around the knee  Protocols used: Knee Pain-A-AH

## 2024-06-06 ENCOUNTER — Ambulatory Visit (INDEPENDENT_AMBULATORY_CARE_PROVIDER_SITE_OTHER)

## 2024-06-06 VITALS — BP 117/72 | HR 85 | Resp 18 | Ht 68.0 in | Wt 202.0 lb

## 2024-06-06 DIAGNOSIS — I10 Essential (primary) hypertension: Secondary | ICD-10-CM

## 2024-06-06 NOTE — Progress Notes (Signed)
   Subjective:    Patient ID: Katrina Cole, female    DOB: 1966-07-13, 58 y.o.   MRN: [id number]  HPI  Patient is here for a 2 week BP check. Last OV Joy Increased hydrochlorothiazide  to 25mg  daily to help with BP control and lower extremity edema. Patient denies CP, SOB, headaches, medication changes, or vision changes.  Review of Systems     Objective:   Physical Exam        Assessment & Plan:   Patients first BP reading is 133/77  second is 117/12 PCP is satisfied with these reading. Pt to continue same regimen and f/u in 6 months

## 2024-06-20 ENCOUNTER — Encounter (HOSPITAL_BASED_OUTPATIENT_CLINIC_OR_DEPARTMENT_OTHER): Payer: Self-pay

## 2024-06-20 ENCOUNTER — Emergency Department (HOSPITAL_BASED_OUTPATIENT_CLINIC_OR_DEPARTMENT_OTHER)

## 2024-06-20 ENCOUNTER — Other Ambulatory Visit: Payer: Self-pay

## 2024-06-20 ENCOUNTER — Emergency Department (HOSPITAL_BASED_OUTPATIENT_CLINIC_OR_DEPARTMENT_OTHER)
Admission: EM | Admit: 2024-06-20 | Discharge: 2024-06-20 | Disposition: A | Discharge status: Home / Self Care | Attending: Emergency Medicine | Admitting: Emergency Medicine

## 2024-06-20 DIAGNOSIS — M1712 Unilateral primary osteoarthritis, left knee: Secondary | ICD-10-CM

## 2024-06-20 MED ORDER — IBUPROFEN 400 MG PO TABS
600.0000 mg | ORAL_TABLET | Freq: Once | ORAL | Status: AC
  Administered 2024-06-20: 600 mg via ORAL
  Filled 2024-06-20: qty 1

## 2024-06-20 MED ORDER — OXYCODONE HCL 5 MG PO TABS
5.0000 mg | ORAL_TABLET | Freq: Once | ORAL | Status: AC
  Administered 2024-06-20: 5 mg via ORAL
  Filled 2024-06-20: qty 1

## 2024-06-20 MED ORDER — MELOXICAM 15 MG PO TABS: 15.0000 mg | ORAL_TABLET | Freq: Every day | ORAL | 0 refills | Status: DC

## 2024-06-20 NOTE — ED Notes (Signed)
 Patient transported to Ultrasound and back without event.

## 2024-06-20 NOTE — ED Triage Notes (Signed)
 Pt reports left leg pain from mid thigh to mid calf since 11/10 seen by PCP and given prednisone  and mobic  without relief. Xray showed arthritis. Unsure of injury . Pain has increased and is now constant throbbing. 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 Fell on ice 2 days . Swollen veins to left calf. Reports lightheaded when she has to bend over due to be unable to squat

## 2024-06-20 NOTE — Discharge Instructions (Addendum)
 Your x-ray of the left knee shows arthritis.  This appears to be the cause of your knee pain.  The hardware from your previous knee surgery is still intact.  The ultrasound of your left leg did not show any blood clot, cyst, or other abnormality to explain your pain and swelling.  You have been prescribed meloxicam  which is a pain medication.  You may take this once daily to help with your knee pain.  If you are taking the meloxicam , you may not take any other NSAID medications at the same time such as ibuprofen , Aleve, naproxen, BC powders.  You may take up to 1000mg  of tylenol  every 6 hours as needed for pain.  Do not take more then 4g per day.  You may apply Voltaren (diclofenac) gel to your knee to help with pain.  This is an over-the-counter medication.  You may continue to wear your knee brace as needed.  You may do gentle physical activity as tolerated.  Please schedule follow-up appointment with the orthopedic provider, Dr. Edna, for further management of your knee pain.  Please return to the ER for any other new or concerning symptoms

## 2024-06-20 NOTE — ED Provider Notes (Signed)
 Katrina Cole Provider Note   CSN: [id number] Arrival date & time: 06/20/24  1127     Patient presents with: Leg Pain   Katrina Cole is a 58 y.o. female with no significant past medical history presents with concern for left leg pain that has been ongoing since 05/22/2024.  She denies any injuries to the leg that caused her pain to occur.  She reports that the pain is mostly over the kneecap, the medial side of her left knee, and also on the proximal aspect of her left calf.  She reports that her leg will become very swollen, especially when she was up working and walking on it.  Denies any numbness in her left lower extremity.  Reports that she was given meloxicam  and steroids by her primary care provider without improvement in symptoms.  She was then seen by orthopedics and given a steroid injection which did provide relief of her pain for about 1 day, then the pain returned.    Leg Pain      Prior to Admission medications   Medication Sig Start Date End Date Taking? Authorizing Provider  cyclobenzaprine  (FLEXERIL ) 10 MG tablet Take 0.5-1 tablets (5-10 mg total) by mouth 3 (three) times daily as needed. 05/22/24   Katrina Mini, NP  hydrochlorothiazide  (HYDRODIURIL ) 25 MG tablet Take 1 tablet (25 mg total) by mouth daily. 05/24/24   Katrina Mini, NP  hydrOXYzine  (ATARAX ) 50 MG tablet Take 0.5-1 tablets (25-50 mg total) by mouth 3 (three) times daily as needed for anxiety. 11/16/23   Katrina Mini, NP  meloxicam  (MOBIC ) 15 MG tablet Take 1 tablet (15 mg total) by mouth daily. 06/20/24   Katrina Palma, PA-C  methylPREDNISolone  (MEDROL  DOSEPAK) 4 MG TBPK tablet Take as directed. 05/22/24   Katrina Mini, NP  sertraline  (ZOLOFT ) 50 MG tablet Take 1 tablet (50 mg total) by mouth daily. 11/16/23   Katrina Mini, NP    Allergies: Penicillins    Review of Systems  Musculoskeletal:        Left knee pain    Updated Vital Signs BP (!) 170/92 (BP  Location: Right Arm)   Pulse 94   Temp 98.3 F (36.8 C) (Oral)   Resp 18   Wt 91.6 kg   LMP  (LMP Unknown)   SpO2 96%   BMI 30.71 kg/m   Physical Exam Vitals and nursing note reviewed.  Constitutional:      Appearance: Normal appearance.  HENT:     Head: Atraumatic.  Cardiovascular:     Rate and Rhythm: Normal rate and regular rhythm.     Comments: 2+ radial pulse bilaterally Pulmonary:     Effort: Pulmonary effort is normal.  Musculoskeletal:     Comments: Left lower extremity:  General Mild diffuse edema of the left knee and upper left calf.  Patient with some varicose veins of the left calf.  No obvious deformity. No erythema, contusions, open wounds   Palpation Tender to palpation over the distal aspect of the femur and over the patella.  Tender over the medial joint line of the left knee as well as over the MCL.  Not as tender over the lateral joint line.  Nontender over the LCL.  Nontender over the popliteal fossa.  Nontender over the fibula and tibia  Tender to palpation of the proximal aspect of the left calf.  ROM Full knee flexion and extension, but with some pain in the left knee.  Full ankle  plantarflexion dorsiflexion bilaterally Able to ambulate  Special tests No ligamentous laxity with Lachman's or posterior drawer testing   Sensation: Sensation intact throughout the lower extremity  Strength: 5/5 strength with resisted knee flexion and extension  5/5 strength with resisted ankle plantarflexion and dorsiflexion   Neurological:     General: No focal deficit present.     Mental Status: She is alert.  Psychiatric:        Mood and Affect: Mood normal.        Behavior: Behavior normal.     (all labs ordered are listed, but only abnormal results are displayed) Labs Reviewed - No data to display  EKG: None  Radiology: DG Knee Complete 4 Views Left Result Date: 06/20/2024 CLINICAL DATA:  Left calf/knee pain and swelling EXAM: LEFT KNEE -  COMPLETE 4 VIEW COMPARISON:  Recent prior radiographs of the left knee 05/22/2024 FINDINGS: Surgical changes suggest prior ACL reconstruction. Mild tricompartmental degenerative osteoarthritis is present most significant in the patellofemoral compartment. There is a small suprapatellar knee joint effusion. No evidence of fracture or malalignment. No lytic or blastic osseous lesion. IMPRESSION: 1. Interval development of a small suprapatellar knee joint effusion which is likely degenerative in nature. 2. 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 3. Surgical changes of remote prior ACL repair. Electronically Signed   By: Katrina Cole M.D.   On: 06/20/2024 13:27   US  Venous Img Lower  Left (DVT Study) Result Date: 06/20/2024 CLINICAL DATA:  Left calf pain and swelling EXAM: LEFT LOWER EXTREMITY VENOUS DOPPLER ULTRASOUND TECHNIQUE: Gray-scale sonography with compression, as well as color and duplex ultrasound, were performed to evaluate the deep venous system(s) from the level of the common femoral vein through the popliteal and proximal calf veins. COMPARISON:  None Available. FINDINGS: VENOUS Normal compressibility of the common femoral, superficial femoral, and popliteal veins, as well as the visualized calf veins. Visualized portions of profunda femoral vein and great saphenous vein unremarkable. No filling defects to suggest DVT on grayscale or color Doppler imaging. Doppler waveforms show normal direction of venous flow, normal respiratory plasticity and response to augmentation. Limited views of the contralateral common femoral vein are unremarkable. OTHER Small amount of fluid identified within the suprapatellar recess. Limitations: none IMPRESSION: 1. Negative for acute or chronic DVT. 2. Small suprapatellar knee joint effusion. Electronically Signed   By: Katrina Cole M.D.   On: 06/20/2024 13:16     Procedures   Medications Ordered  in the ED  ibuprofen  (ADVIL ) tablet 600 mg (600 mg Oral Given 06/20/24 1230)  oxyCODONE  (Oxy IR/ROXICODONE ) immediate release tablet 5 mg (5 mg Oral Given 06/20/24 1230)                                    Medical Decision Making Amount and/or Complexity of Data Reviewed Radiology: ordered.  Risk Prescription drug management.    Differential diagnosis includes but is not limited to fracture, dislocation, sprain, strain, meniscal injury, osteoarthritis, septic arthritis, DVT, Baker's cyst  ED Course:  Upon initial evaluation, patient is well-appearing, no acute distress.  Reporting pain to the left knee that has been ongoing for the past month.  She is most tender at the patellofemoral joint and over the medial joint line of the left knee.  Also mildly tender to the proximal aspect of the left calf.  Patient is neurovascularly intact in the left lower extremity.  No erythema or abnormal warmth to touch of the left knee, full range of motion of the left knee, no overlying wounds, I doubt septic arthritis at this time. Question if this pain could be due to flareup of arthritis versus DVT.  Will obtain x-ray imaging and ultrasound for further evaluation.  Imaging Studies ordered: I ordered imaging studies including x-ray left knee, ultrasound left lower extremity I independently visualized the imaging with scope of interpretation limited to determining acute life threatening conditions related to emergency care. Imaging showed   X-ray of the left knee IMPRESSION:  1. Interval development of a small suprapatellar knee joint effusion  which is likely degenerative in nature.  2. Mild tricompartmental degenerative osteoarthritis most  significant in the patellofemoral compartment.  3. Surgical changes of remote prior ACL repair.   Ultrasound left lower extremity: IMPRESSION:  1. Negative for acute or chronic DVT.  2. Small suprapatellar knee joint effusion.    I agree with the  radiologist interpretation   Medications Given: Oxycodone  Ibuprofen   Imaging was reviewed which revealed tricompartmental osteoarthritis.  This corresponds with patient's area of pain.  Also would explain her knee effusion.  Her ultrasound of the left lower extremity did not reveal any DVT, Baker's cyst, or other abnormality.  Patient states she would like to continue on the Mobic  that was prescribed for her previously, we will send this prescription.  Instructed her on use of Tylenol  and Voltaren gel.  She was offered crutches, but declined at this time. Patient able to ambulate without difficulty prior to discharge   Patient stable and appropriate for discharge home.     Impression: Osteoarthritis of the left knee   Disposition:  Patient discharged home with instructions to call Dr. Mickael office for a follow up appointment as soon as possible. Tylenol  and meloxicam  as needed for pain. Voltaren gel for pain.  Return precautions given and patient verbalized understanding.    Record Review: External records from outside source obtained and reviewed including orthopedic note from 06/01/2024 where she was seen for left knee pain and given steroid injection     This chart was dictated using voice recognition software, Dragon. Despite the best efforts of this provider to proofread and correct errors, errors may still occur which can change documentation meaning.       Final diagnoses:  Osteoarthritis of left knee, unspecified osteoarthritis type    ED Discharge Orders          Ordered    meloxicam  (MOBIC ) 15 MG tablet  Daily        06/20/24 1355               Katrina Cole, Katrina Cole 06/20/24 1402

## 2024-06-20 NOTE — ED Notes (Signed)
 ED Provider at bedside.

## 2024-07-04 ENCOUNTER — Encounter: Payer: Self-pay | Admitting: Medical-Surgical

## 2024-07-04 ENCOUNTER — Ambulatory Visit: Admitting: Medical-Surgical

## 2024-07-04 VITALS — BP 130/77 | HR 86 | Temp 99.4°F | Resp 16 | Ht 68.0 in | Wt 203.1 lb

## 2024-07-04 DIAGNOSIS — M25562 Pain in left knee: Secondary | ICD-10-CM | POA: Diagnosis not present

## 2024-07-04 DIAGNOSIS — G8929 Other chronic pain: Secondary | ICD-10-CM

## 2024-07-04 MED ORDER — TRAMADOL HCL 50 MG PO TABS: 100.0000 mg | ORAL_TABLET | Freq: Three times a day (TID) | ORAL | 0 refills | Status: DC | PRN

## 2024-07-04 MED ORDER — CYCLOBENZAPRINE HCL 10 MG PO TABS: 5.0000 mg | ORAL_TABLET | Freq: Three times a day (TID) | ORAL | 3 refills | Status: AC | PRN

## 2024-07-04 NOTE — Progress Notes (Signed)
" ° °       Established patient visit   History of Present Illness   Discussed the use of AI scribe software for clinical note transcription with the patient, who gave verbal consent to proceed.  History of Present Illness   Katrina Cole is a 58 year old female who presents with persistent leg pain and swelling.  Lower extremity pain and swelling - Severe left knee pain and swelling for at least 8 to 10 weeks - Pain is worst along the medial joint line and behind the  - Marked swelling, at its worst approximately twice normal size - Swelling has improved but persistent tightness and discomfort remain - Pain worsens after approximately 5 hours of work involving fast walking and heavy lifting - Pain and swelling have limited ability to perform work duties - Job requires fast walking and heavy lifting - Sent home from work due to inability to walk secondary to pain  Sleep disturbance - Significant sleep disruption due to pain - Pain wakes her when changing position at night  Prior evaluations and interventions - Evaluated in the emergency room for severe pain - Ultrasound performed to rule out blood clots - X-ray obtained - Received arthritis injection with relief for one day - Meloxicam  provided no benefit  - Flexeril  improved sleep but she ran out of medication - Regular stretching and ROM exercises provide gradual improvement     Physical Exam   Physical Exam Musculoskeletal:     Left knee: Swelling present. No erythema or ecchymosis. Normal range of motion. Tenderness (see diagram) present.       Legs:    Assessment & Plan     Chronic left knee pain Swelling and tightness exacerbated by activity. Pain persists despite reduced work hours, affecting daily activities and sleep. Conservative measures include stretching and range of motion exercises. Flexeril  provided some relief at night. Meloxicam  was ineffective. She has failed greater than 6 weeks of conservative measures  so ordering MRI for further evaluation. Tramadol  considered for short-term pain management, with gabapentin  or Lyrica as long-term options if needed. - Ordered MRI of the left knee to evaluate joint and surrounding structures. - Refilled Flexeril  10mg  TID prn. - Prescribed tramadol  50-100 mg every 8 hours for short-term pain management. - Continue conservative measures including stretching and range of motion exercises. - Ordered uric acid level to evaluate for gout.    Follow up   Return if symptoms worsen or fail to improve. __________________________________ Zada FREDRIK Palin, DNP, APRN, FNP-BC Primary Care and Sports Medicine [facility] "

## 2024-07-05 LAB — URIC ACID: Uric Acid: 4.7 mg/dL (ref 3.0–7.2)

## 2024-07-07 ENCOUNTER — Telehealth: Payer: Self-pay

## 2024-07-07 ENCOUNTER — Ambulatory Visit: Payer: Self-pay | Admitting: Medical-Surgical

## 2024-07-07 DIAGNOSIS — G8929 Other chronic pain: Secondary | ICD-10-CM

## 2024-07-07 MED ORDER — TRAMADOL HCL 50 MG PO TABS: 50.0000 mg | ORAL_TABLET | Freq: Three times a day (TID) | ORAL | 0 refills | Status: AC | PRN

## 2024-07-07 NOTE — Telephone Encounter (Signed)
 Spoke with Therisa from [facility] who stated pts insurance is only willing to fill 5 tablets a day. She said you can either change prescription to be in compliance with the insurance or we can do a PA. Pls advise thanks

## 2024-07-07 NOTE — Telephone Encounter (Signed)
 Source  Katrina Cole (Patient)   Subject  Katrina Cole (Patient)   Topic  Clinical - Prescription Issue    Communication  Reason for CRM: Patient states was prescribed traMADol  (ULTRAM ) 50 MG tablet over the amount of tablets that insurance will prescribe. Please fix and resend.          [id number] - [place], [place] - [address] AT [address]

## 2024-07-10 NOTE — Telephone Encounter (Signed)
 Spoke with the pt to let her know the prescription had been changed. She is also inquiring about her MRI. Provided imaging's phone number to try to schedule before 07/12/24

## 2024-07-23 ENCOUNTER — Ambulatory Visit

## 2024-07-23 DIAGNOSIS — G8929 Other chronic pain: Secondary | ICD-10-CM | POA: Diagnosis not present

## 2024-07-23 DIAGNOSIS — M1712 Unilateral primary osteoarthritis, left knee: Secondary | ICD-10-CM

## 2024-07-23 DIAGNOSIS — M25562 Pain in left knee: Secondary | ICD-10-CM | POA: Diagnosis not present

## 2024-08-10 ENCOUNTER — Other Ambulatory Visit: Payer: Self-pay | Admitting: Medical-Surgical
